# Patient Record
Sex: Female | Born: 1940 | Race: White | Hispanic: No
Health system: Southern US, Community
[De-identification: ages and names within clinical notes are randomized; demographics above are authoritative.]

---

## 1999-08-19 ENCOUNTER — Inpatient Hospital Stay (HOSPITAL_COMMUNITY): Admission: EM | Admit: 1999-08-19 | Discharge: 1999-08-22 | Payer: Self-pay | Admitting: Emergency Medicine

## 1999-08-19 ENCOUNTER — Encounter: Payer: Self-pay | Admitting: Emergency Medicine

## 2011-09-08 ENCOUNTER — Ambulatory Visit: Payer: Self-pay | Admitting: Oncology

## 2011-09-08 ENCOUNTER — Ambulatory Visit: Payer: Self-pay | Admitting: Internal Medicine

## 2011-09-17 ENCOUNTER — Inpatient Hospital Stay: Payer: Self-pay | Admitting: Internal Medicine

## 2011-09-17 LAB — COMPREHENSIVE METABOLIC PANEL
Albumin: 3.8 g/dL (ref 3.4–5.0)
Alkaline Phosphatase: 96 U/L (ref 50–136)
Anion Gap: 10 (ref 7–16)
BUN: 12 mg/dL (ref 7–18)
Bilirubin,Total: 0.7 mg/dL (ref 0.2–1.0)
Calcium, Total: 9.1 mg/dL (ref 8.5–10.1)
Chloride: 97 mmol/L — ABNORMAL LOW (ref 98–107)
Co2: 29 mmol/L (ref 21–32)
Creatinine: 0.64 mg/dL (ref 0.60–1.30)
EGFR (African American): >60
EGFR (Non-African Amer.): >60
Glucose: 112 mg/dL — ABNORMAL HIGH (ref 65–99)
Osmolality: 272 (ref 275–301)
Potassium: 3.7 mmol/L (ref 3.5–5.1)
SGOT(AST): 35 U/L (ref 15–37)
SGPT (ALT): 32 U/L
Sodium: 136 mmol/L (ref 136–145)
Total Protein: 7.6 g/dL (ref 6.4–8.2)

## 2011-09-17 LAB — URINALYSIS, COMPLETE
Bilirubin,UR: NEGATIVE
Glucose,UR: NEGATIVE mg/dL (ref 0–75)
Hyaline Cast: 4
Ketone: NEGATIVE
Nitrite: NEGATIVE
Ph: 5 (ref 4.5–8.0)
Protein: NEGATIVE
RBC,UR: 37 /HPF (ref 0–5)
Specific Gravity: 1.018 (ref 1.003–1.030)
Squamous Epithelial: 2
WBC UR: 3 /HPF (ref 0–5)

## 2011-09-17 LAB — PROTIME-INR
INR: 1
Prothrombin Time: 13.8 secs (ref 11.5–14.7)

## 2011-09-17 LAB — CBC
HCT: 41.5 % (ref 35.0–47.0)
HGB: 14 g/dL (ref 12.0–16.0)
MCH: 31.2 pg (ref 26.0–34.0)
MCHC: 33.8 g/dL (ref 32.0–36.0)
MCV: 92 fL (ref 80–100)
Platelet: 230 10*3/uL (ref 150–440)
RBC: 4.5 10*6/uL (ref 3.80–5.20)
RDW: 13 % (ref 11.5–14.5)
WBC: 12.6 10*3/uL — ABNORMAL HIGH (ref 3.6–11.0)

## 2011-09-17 LAB — CK TOTAL AND CKMB (NOT AT ARMC)
CK, Total: 53 U/L (ref 21–215)
CK-MB: 1.7 ng/mL (ref 0.5–3.6)

## 2011-09-17 LAB — TROPONIN I: Troponin-I: 0.38 ng/mL — ABNORMAL HIGH

## 2011-09-17 LAB — LIPASE, BLOOD: Lipase: 87 U/L (ref 73–393)

## 2011-09-18 DIAGNOSIS — R9431 Abnormal electrocardiogram [ECG] [EKG]: Secondary | ICD-10-CM

## 2011-09-18 DIAGNOSIS — I369 Nonrheumatic tricuspid valve disorder, unspecified: Secondary | ICD-10-CM

## 2011-09-18 DIAGNOSIS — R748 Abnormal levels of other serum enzymes: Secondary | ICD-10-CM

## 2011-09-18 LAB — BASIC METABOLIC PANEL
Anion Gap: 12 (ref 7–16)
BUN: 9 mg/dL (ref 7–18)
Calcium, Total: 8 mg/dL — ABNORMAL LOW (ref 8.5–10.1)
Chloride: 102 mmol/L (ref 98–107)
Co2: 26 mmol/L (ref 21–32)
Creatinine: 0.5 mg/dL — ABNORMAL LOW (ref 0.60–1.30)
EGFR (African American): >60
EGFR (Non-African Amer.): >60
Glucose: 127 mg/dL — ABNORMAL HIGH (ref 65–99)
Osmolality: 280 (ref 275–301)
Potassium: 3.2 mmol/L — ABNORMAL LOW (ref 3.5–5.1)
Sodium: 140 mmol/L (ref 136–145)

## 2011-09-18 LAB — CBC WITH DIFFERENTIAL/PLATELET
Basophil #: 0 10*3/uL (ref 0.0–0.1)
Basophil %: 0 %
Eosinophil #: 0.1 10*3/uL (ref 0.0–0.7)
Eosinophil %: 0.6 %
HCT: 33.9 % — ABNORMAL LOW (ref 35.0–47.0)
HGB: 11.7 g/dL — ABNORMAL LOW (ref 12.0–16.0)
Lymphocyte #: 1.4 10*3/uL (ref 1.0–3.6)
Lymphocyte %: 11.2 %
MCH: 31.5 pg (ref 26.0–34.0)
MCHC: 34.5 g/dL (ref 32.0–36.0)
MCV: 91 fL (ref 80–100)
Monocyte #: 0.7 10*3/uL (ref 0.0–0.7)
Monocyte %: 6 %
Neutrophil #: 10.1 10*3/uL — ABNORMAL HIGH (ref 1.4–6.5)
Neutrophil %: 82.2 %
Platelet: 189 10*3/uL (ref 150–440)
RBC: 3.71 10*6/uL — ABNORMAL LOW (ref 3.80–5.20)
RDW: 12.6 % (ref 11.5–14.5)
WBC: 12.3 10*3/uL — ABNORMAL HIGH (ref 3.6–11.0)

## 2011-09-18 LAB — LIPID PANEL
Cholesterol: 128 mg/dL (ref 0–200)
Ldl Cholesterol, Calc: 76 mg/dL (ref 0–100)
Triglycerides: 68 mg/dL (ref 0–200)

## 2011-09-18 LAB — TROPONIN I
Troponin-I: 0.26 ng/mL — ABNORMAL HIGH
Troponin-I: 0.16 ng/mL — ABNORMAL HIGH

## 2011-09-18 LAB — CLOSTRIDIUM DIFFICILE BY PCR

## 2011-09-18 LAB — CK TOTAL AND CKMB (NOT AT ARMC)
CK, Total: 47 U/L (ref 21–215)
CK-MB: 1.3 ng/mL (ref 0.5–3.6)

## 2011-09-19 LAB — CBC WITH DIFFERENTIAL/PLATELET
Eosinophil #: 0.2 10*3/uL (ref 0.0–0.7)
Eosinophil %: 1.4 %
HCT: 36.8 % (ref 35.0–47.0)
Lymphocyte #: 1.3 10*3/uL (ref 1.0–3.6)
MCH: 31.2 pg (ref 26.0–34.0)
MCHC: 33.7 g/dL (ref 32.0–36.0)
Monocyte #: 0.8 10*3/uL — ABNORMAL HIGH (ref 0.0–0.7)
Platelet: 166 10*3/uL (ref 150–440)
RBC: 3.97 10*6/uL (ref 3.80–5.20)
RDW: 12.9 % (ref 11.5–14.5)

## 2011-09-19 LAB — BASIC METABOLIC PANEL
BUN: 5 mg/dL — ABNORMAL LOW (ref 7–18)
Calcium, Total: 8.7 mg/dL (ref 8.5–10.1)
Creatinine: 0.64 mg/dL (ref 0.60–1.30)
Glucose: 126 mg/dL — ABNORMAL HIGH (ref 65–99)

## 2011-09-20 DIAGNOSIS — R079 Chest pain, unspecified: Secondary | ICD-10-CM

## 2011-09-23 ENCOUNTER — Emergency Department: Payer: Self-pay | Admitting: Emergency Medicine

## 2011-09-23 LAB — DIFFERENTIAL
Basophil #: 0 10*3/uL (ref 0.0–0.1)
Basophil %: 0.2 %
Eosinophil #: 0 10*3/uL (ref 0.0–0.7)
Lymphocyte #: 1.6 10*3/uL (ref 1.0–3.6)
Monocyte #: 1.5 10*3/uL — ABNORMAL HIGH (ref 0.0–0.7)
Neutrophil #: 12 10*3/uL — ABNORMAL HIGH (ref 1.4–6.5)
Neutrophil %: 79.1 %
Segmented Neutrophils: 79 %

## 2011-09-23 LAB — COMPREHENSIVE METABOLIC PANEL
Anion Gap: 12 (ref 7–16)
Bilirubin,Total: 0.7 mg/dL (ref 0.2–1.0)
Calcium, Total: 8.5 mg/dL (ref 8.5–10.1)
Chloride: 98 mmol/L (ref 98–107)
Co2: 29 mmol/L (ref 21–32)
EGFR (African American): >60
EGFR (Non-African Amer.): >60
Osmolality: 281 (ref 275–301)
Potassium: 3.6 mmol/L (ref 3.5–5.1)
SGOT(AST): 56 U/L — ABNORMAL HIGH (ref 15–37)
SGPT (ALT): 37 U/L

## 2011-09-23 LAB — CBC
MCH: 31.1 pg (ref 26.0–34.0)
MCHC: 33.7 g/dL (ref 32.0–36.0)
MCV: 92 fL (ref 80–100)
Platelet: 60 10*3/uL — ABNORMAL LOW (ref 150–440)
RDW: 12.9 % (ref 11.5–14.5)

## 2011-09-23 LAB — URINALYSIS, COMPLETE
Hyaline Cast: 2
Ketone: NEGATIVE
Nitrite: NEGATIVE
Ph: 5 (ref 4.5–8.0)
Protein: 30
Specific Gravity: 1.021 (ref 1.003–1.030)
WBC UR: 110 /HPF (ref 0–5)

## 2011-09-25 ENCOUNTER — Encounter: Payer: Self-pay | Admitting: Cardiovascular Disease

## 2011-09-26 ENCOUNTER — Inpatient Hospital Stay: Payer: Self-pay | Admitting: Internal Medicine

## 2011-09-26 LAB — COMPREHENSIVE METABOLIC PANEL
Albumin: 2.9 g/dL — ABNORMAL LOW (ref 3.4–5.0)
Bilirubin,Total: 0.6 mg/dL (ref 0.2–1.0)
Calcium, Total: 8.1 mg/dL — ABNORMAL LOW (ref 8.5–10.1)
Chloride: 103 mmol/L (ref 98–107)
Co2: 30 mmol/L (ref 21–32)
EGFR (Non-African Amer.): >60
Osmolality: 275 (ref 275–301)
Potassium: 3.5 mmol/L (ref 3.5–5.1)
SGOT(AST): 27 U/L (ref 15–37)
Sodium: 138 mmol/L (ref 136–145)
Total Protein: 6.1 g/dL — ABNORMAL LOW (ref 6.4–8.2)

## 2011-09-26 LAB — URINALYSIS, COMPLETE
Bacteria: NONE SEEN
Glucose,UR: 50 mg/dL (ref 0–75)
Ketone: NEGATIVE
Specific Gravity: 1.012 (ref 1.003–1.030)
Squamous Epithelial: 2
WBC UR: 61 /HPF (ref 0–5)

## 2011-09-26 LAB — CBC
HCT: 33.3 % — ABNORMAL LOW (ref 35.0–47.0)
HGB: 11.3 g/dL — ABNORMAL LOW (ref 12.0–16.0)
MCH: 31.3 pg (ref 26.0–34.0)
MCHC: 34 g/dL (ref 32.0–36.0)
MCV: 92 fL (ref 80–100)
Platelet: 79 10*3/uL — ABNORMAL LOW (ref 150–440)
RBC: 3.61 10*6/uL — ABNORMAL LOW (ref 3.80–5.20)
RDW: 13.4 % (ref 11.5–14.5)
WBC: 15.1 10*3/uL — ABNORMAL HIGH (ref 3.6–11.0)

## 2011-09-26 LAB — CK TOTAL AND CKMB (NOT AT ARMC)
CK, Total: 55 U/L (ref 21–215)
CK-MB: <0.5 ng/mL — ABNORMAL LOW (ref 0.5–3.6)

## 2011-09-26 LAB — TROPONIN I: Troponin-I: 0.42 ng/mL — ABNORMAL HIGH

## 2011-09-27 LAB — APTT: Activated PTT: 53.8 secs — ABNORMAL HIGH (ref 23.6–35.9)

## 2011-09-27 LAB — CBC WITH DIFFERENTIAL/PLATELET
Basophil %: 0.1 %
Eosinophil %: 0.5 %
Lymphocyte #: 1.4 10*3/uL (ref 1.0–3.6)
MCH: 31.3 pg (ref 26.0–34.0)
Monocyte #: 0.9 10*3/uL — ABNORMAL HIGH (ref 0.0–0.7)
Monocyte %: 6.7 %
Neutrophil #: 11.7 10*3/uL — ABNORMAL HIGH (ref 1.4–6.5)
Platelet: 93 10*3/uL — ABNORMAL LOW (ref 150–440)
RBC: 3.2 10*6/uL — ABNORMAL LOW (ref 3.80–5.20)
RDW: 13.3 % (ref 11.5–14.5)

## 2011-09-27 LAB — BASIC METABOLIC PANEL
BUN: 7 mg/dL (ref 7–18)
Calcium, Total: 7.2 mg/dL — ABNORMAL LOW (ref 8.5–10.1)
Chloride: 105 mmol/L (ref 98–107)
Co2: 29 mmol/L (ref 21–32)
Creatinine: 0.61 mg/dL (ref 0.60–1.30)
EGFR (Non-African Amer.): >60
Glucose: 108 mg/dL — ABNORMAL HIGH (ref 65–99)
Osmolality: 282 (ref 275–301)
Potassium: 3.2 mmol/L — ABNORMAL LOW (ref 3.5–5.1)
Sodium: 142 mmol/L (ref 136–145)

## 2011-09-27 LAB — TSH: Thyroid Stimulating Horm: 1.8 u[IU]/mL

## 2011-09-27 LAB — CK TOTAL AND CKMB (NOT AT ARMC)
CK, Total: 33 U/L (ref 21–215)
CK-MB: 1 ng/mL (ref 0.5–3.6)
CK-MB: 1 ng/mL (ref 0.5–3.6)

## 2011-09-27 LAB — TROPONIN I: Troponin-I: 0.32 ng/mL — ABNORMAL HIGH

## 2011-09-28 LAB — APTT
Activated PTT: >160 secs (ref 23.6–35.9)
Activated PTT: 49.2 secs — ABNORMAL HIGH (ref 23.6–35.9)

## 2011-09-29 ENCOUNTER — Ambulatory Visit: Payer: Self-pay | Admitting: Oncology

## 2011-09-29 LAB — APTT: Activated PTT: 81.5 secs — ABNORMAL HIGH (ref 23.6–35.9)

## 2011-09-29 LAB — PLATELET COUNT: Platelet: 233 10*3/uL (ref 150–440)

## 2011-09-29 LAB — HEMOGLOBIN: HGB: 9.9 g/dL — ABNORMAL LOW (ref 12.0–16.0)

## 2011-09-30 LAB — URINALYSIS, COMPLETE
Bilirubin,UR: NEGATIVE
Nitrite: NEGATIVE
Ph: 6 (ref 4.5–8.0)
Specific Gravity: 1.017 (ref 1.003–1.030)
Squamous Epithelial: 7

## 2011-09-30 LAB — BASIC METABOLIC PANEL
BUN: 15 mg/dL (ref 7–18)
Calcium, Total: 7.6 mg/dL — ABNORMAL LOW (ref 8.5–10.1)
Chloride: 107 mmol/L (ref 98–107)
Co2: 28 mmol/L (ref 21–32)
Creatinine: 0.72 mg/dL (ref 0.60–1.30)
EGFR (African American): >60
EGFR (Non-African Amer.): >60
Glucose: 120 mg/dL — ABNORMAL HIGH (ref 65–99)
Potassium: 3.2 mmol/L — ABNORMAL LOW (ref 3.5–5.1)
Sodium: 143 mmol/L (ref 136–145)

## 2011-09-30 LAB — APTT: Activated PTT: 79 secs — ABNORMAL HIGH (ref 23.6–35.9)

## 2011-10-01 LAB — PLATELET COUNT: Platelet: 374 10*3/uL (ref 150–440)

## 2011-10-02 LAB — CULTURE, BLOOD (SINGLE)

## 2011-10-02 LAB — APTT: Activated PTT: 32.1 secs (ref 23.6–35.9)

## 2011-10-06 ENCOUNTER — Ambulatory Visit: Payer: Self-pay | Admitting: Oncology

## 2011-11-06 ENCOUNTER — Ambulatory Visit: Payer: Self-pay | Admitting: Oncology

## 2011-11-08 ENCOUNTER — Ambulatory Visit: Payer: Self-pay | Admitting: Specialist

## 2011-11-08 LAB — CREATININE, SERUM: Creatinine: 0.53 mg/dL — ABNORMAL LOW (ref 0.60–1.30)

## 2011-11-09 ENCOUNTER — Ambulatory Visit: Payer: Self-pay | Admitting: Oncology

## 2011-11-09 ENCOUNTER — Emergency Department: Payer: Self-pay | Admitting: *Deleted

## 2011-11-20 ENCOUNTER — Ambulatory Visit: Payer: Self-pay | Admitting: Oncology

## 2011-11-20 LAB — PLATELET COUNT: Platelet: 182 10*3/uL (ref 150–440)

## 2011-11-20 LAB — PROTIME-INR
INR: 1.2
Prothrombin Time: 15.7 secs — ABNORMAL HIGH (ref 11.5–14.7)

## 2011-11-20 LAB — APTT: Activated PTT: 27.2 secs (ref 23.6–35.9)

## 2011-12-06 ENCOUNTER — Ambulatory Visit: Payer: Self-pay | Admitting: Oncology

## 2011-12-06 ENCOUNTER — Ambulatory Visit: Payer: Self-pay | Admitting: Vascular Surgery

## 2011-12-07 LAB — COMPREHENSIVE METABOLIC PANEL
Albumin: 3.5 g/dL (ref 3.4–5.0)
BUN: 13 mg/dL (ref 7–18)
Calcium, Total: 8.9 mg/dL (ref 8.5–10.1)
Chloride: 103 mmol/L (ref 98–107)
Co2: 32 mmol/L (ref 21–32)
EGFR (Non-African Amer.): >60
Potassium: 3.8 mmol/L (ref 3.5–5.1)
SGOT(AST): 35 U/L (ref 15–37)
Sodium: 141 mmol/L (ref 136–145)

## 2011-12-07 LAB — CBC CANCER CENTER
Basophil #: 0.1 x10 3/mm (ref 0.0–0.1)
Basophil %: 0.7 %
Eosinophil #: 0.1 x10 3/mm (ref 0.0–0.7)
Eosinophil %: 1.3 %
HCT: 32.6 % — ABNORMAL LOW (ref 35.0–47.0)
HGB: 10.5 g/dL — ABNORMAL LOW (ref 12.0–16.0)
Lymphocyte %: 16.9 %
MCH: 29.6 pg (ref 26.0–34.0)
Monocyte #: 0.5 x10 3/mm (ref 0.2–0.9)
Neutrophil #: 5.2 x10 3/mm (ref 1.4–6.5)
Platelet: 96 x10 3/mm — ABNORMAL LOW (ref 150–440)
RBC: 3.56 10*6/uL — ABNORMAL LOW (ref 3.80–5.20)
RDW: 14.6 % — ABNORMAL HIGH (ref 11.5–14.5)

## 2012-01-06 ENCOUNTER — Ambulatory Visit: Payer: Self-pay | Admitting: Oncology

## 2012-01-06 DEATH — deceased

## 2012-04-16 IMAGING — CT CT CHEST W/ CM
2 series · 15 of 31 positions shown, 19 images · IV contrast (agent unspecified)
Comparison: none

REASON FOR EXAM: IANIK lobe lesion
COMMENTS:

PROCEDURE:     CT  - CT CHEST WITH CONTRAST  - November 08, 2011  [DATE]
RESULT:     Comparison is made to a prior study dated 09/26/2011.
TECHNIQUE: Helical 5 mm sections were obtained from the thoracic inlet
through the lung bases status post intravenous administration of 60 mL
[id number].

[Series 2: soft tissue · axial · 0.60mm/px · z∈[-220,-160]mm · 2 of 76 slices shown]
[im 6/76  mediastinal]
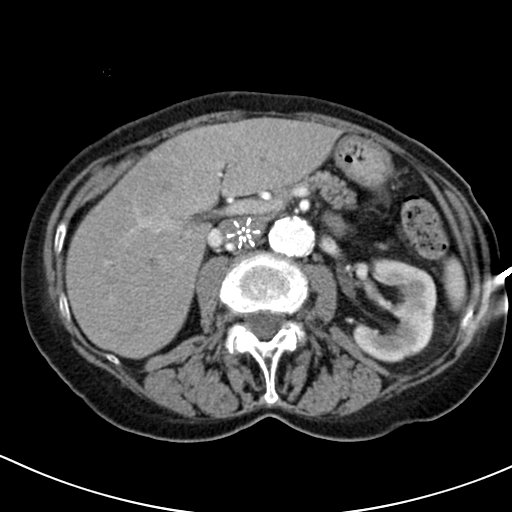
[im 18/76  mediastinal]
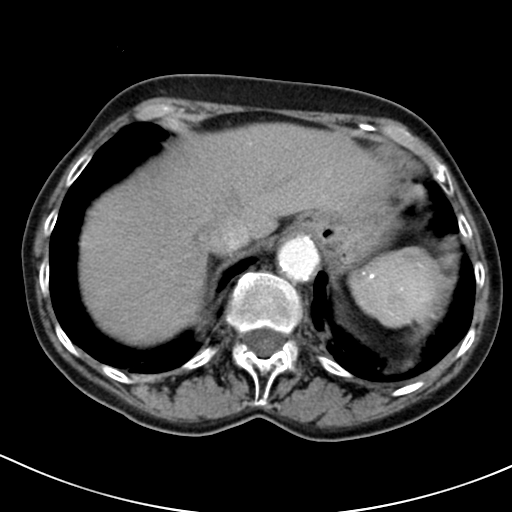

[Series 3: lung windows · axial · 0.60mm/px · z∈[-206,+100]mm · 13 of 73 slices shown, 17 images]
[im 6/73  mediastinal]
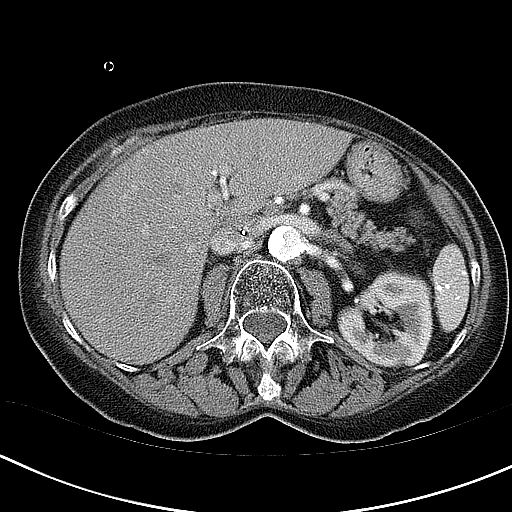
[im 6/73  lung]
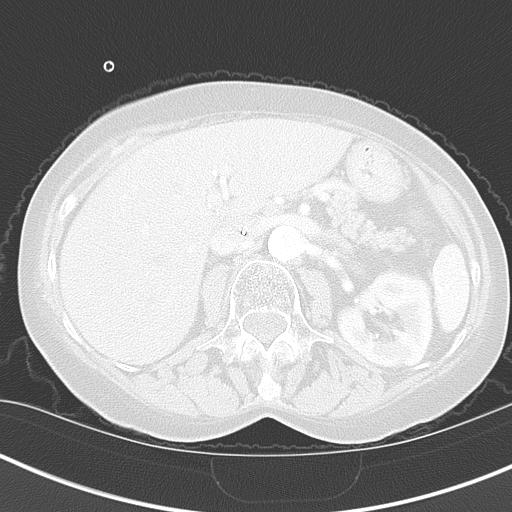
[im 12/73  lung]
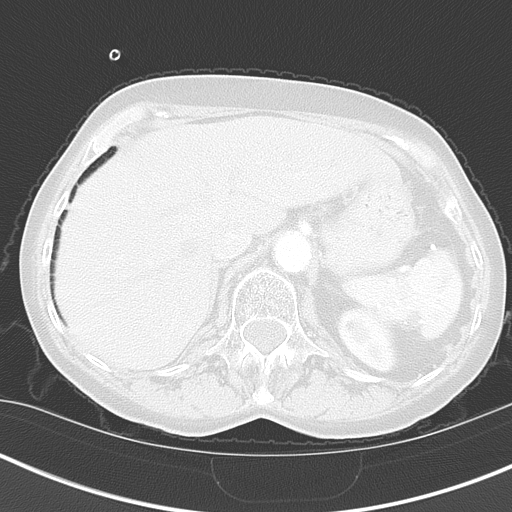
[im 17/73  lung]
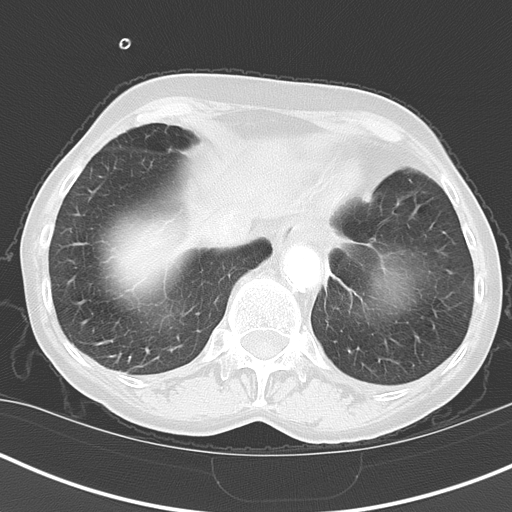
[im 23/73  lung]
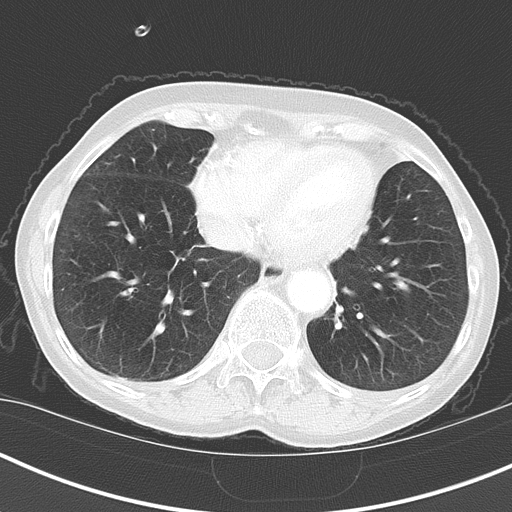
[im 28/73  mediastinal]
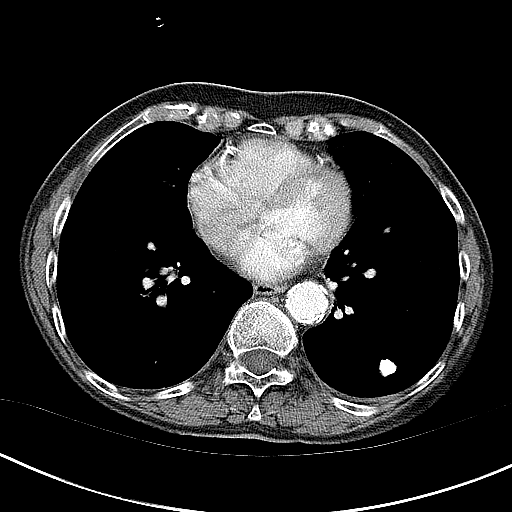
[im 28/73  lung]
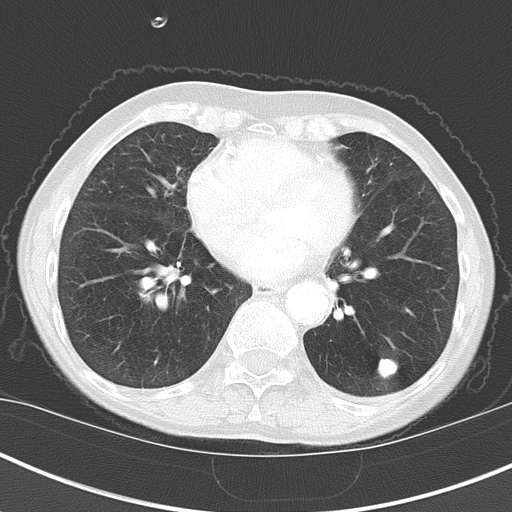
[im 34/73  lung]
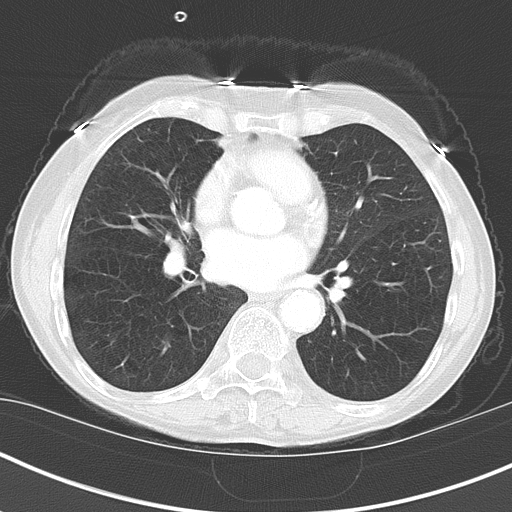
[im 37/73  lung]
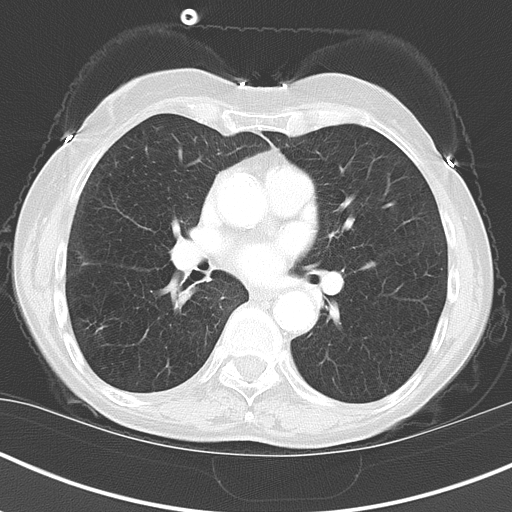
[im 39/73  lung]
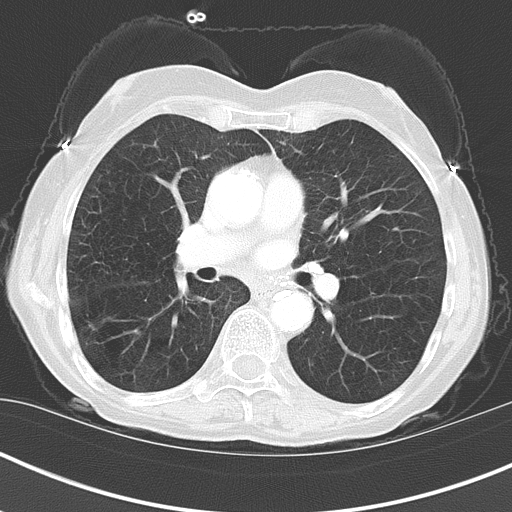
[im 45/73  mediastinal]
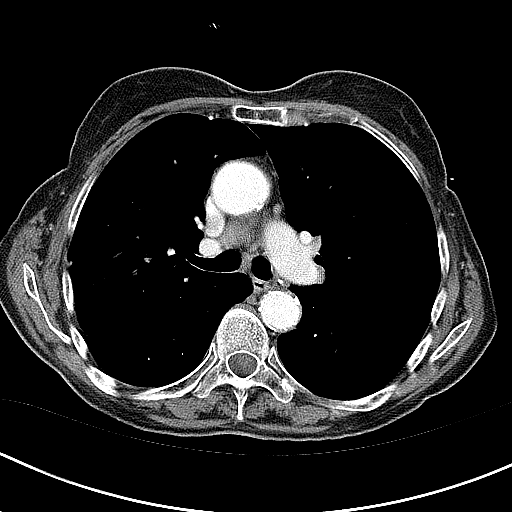
[im 45/73  lung]
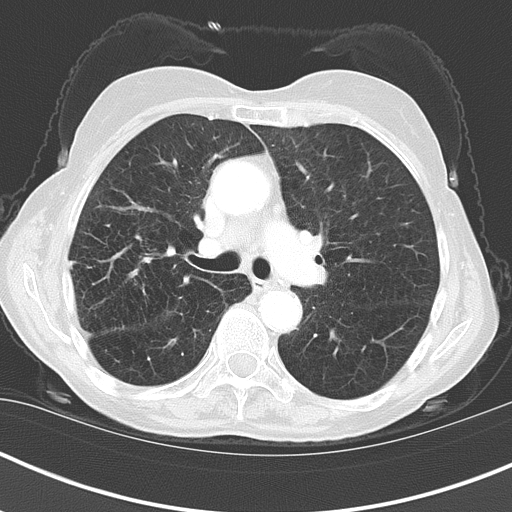
[im 50/73  lung]
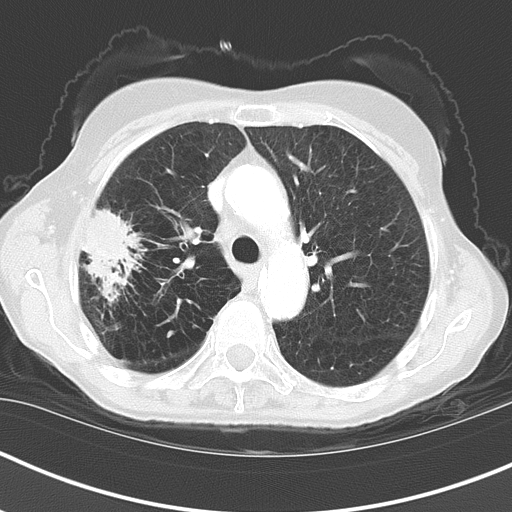
[im 56/73  lung]
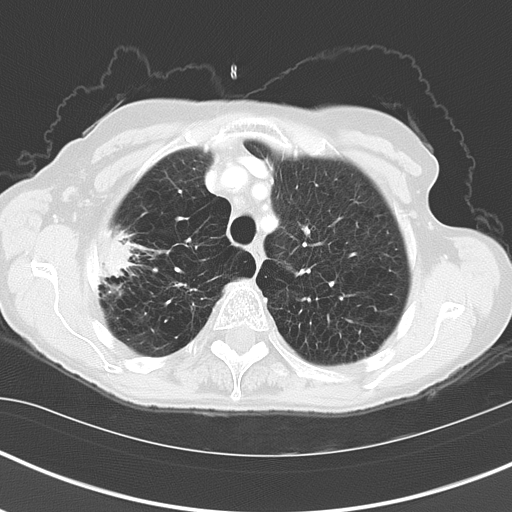
[im 61/73  lung]
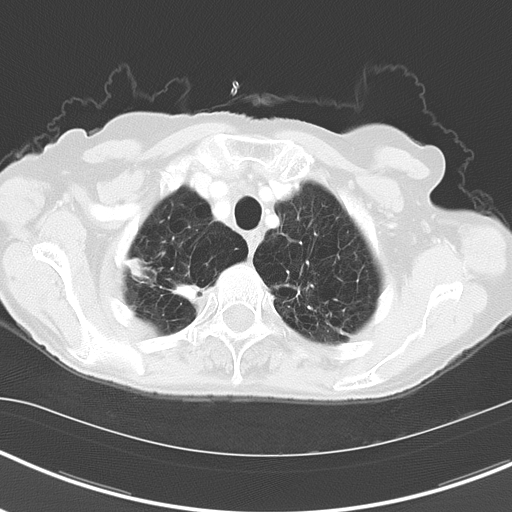
[im 67/73  mediastinal]
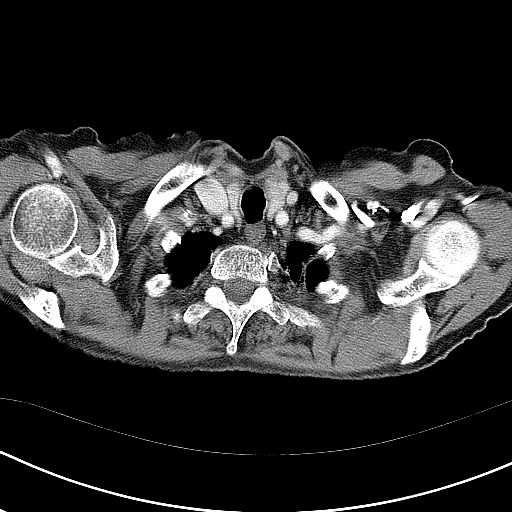
[im 67/73  lung]
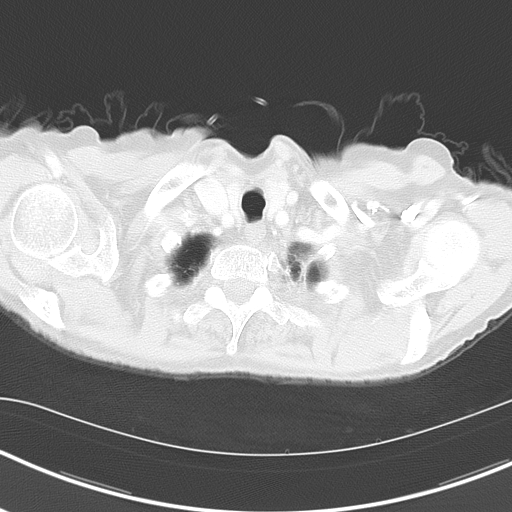

[15 of 31 positions shown; findings below may reference images not displayed]

FINDINGS: Evaluation of the mediastinum demonstrates precarinal adenopathy.
Largest nodal mass measures approximately 1.52 cm in shortest dimension.
Smaller lymph nodes are identified within the AP window.

Previously described pleural-based spiculated density within the right upper
lobe is once again appreciated and appears more prominent measuring 0.55 x
3.51 cm in orthogonal dimensions. A second nodule is identified within the
left lower lobe medial basal segment. This is calcified indicative of a
calcified granuloma. Fluid versus thickening is identified along the major
fissure on the right. There are fibrotic and emphysematous changes
throughout both lungs. Multiple calcifications are identified within the
spleen. An inferior vena cava filter is appreciated. The visualized upper
abdominal viscera are unremarkable.
IMPRESSION: 1.     Increased conspicuity of the spiculated density along the periphery
of the right mid hemithorax. Mediastinal adenopathy is appreciated.
2.     Not mentioned above, stable areas of partially calcified apical
pleural plaques are identified. These findings may represent the sequela of
prior asbestos exposure. The peripheral density in the right hemithorax, in
the appropriate clinical setting, is concerning for neoplastic disease
particularly in the absence of signs and symptoms of infection. There are
also findings concerning for mediastinal metastatic adenopathy.

## 2012-04-28 IMAGING — CT CT ASPIRATION
1 of 2 series · 15 of 32 positions shown, 20 images · non-contrast
Comparison: none

REASON FOR EXAM: RUL nodule
COMMENTS:

[Series 2: soft tissue · axial · 0.71mm/px · z∈[-141,-61]mm · 15 of 19 slices shown, 20 images]
[im 2/19  soft-tissue]
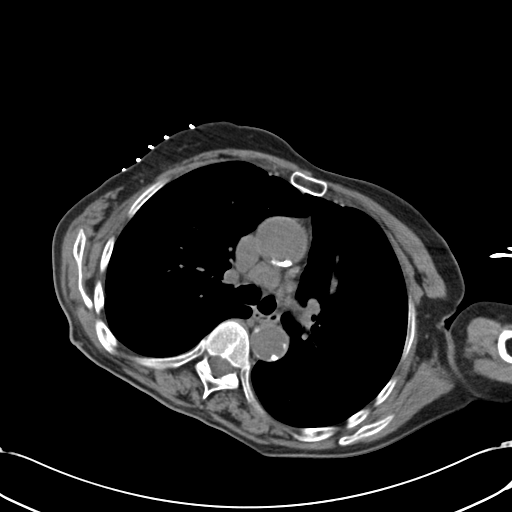
[im 2/19  bone]
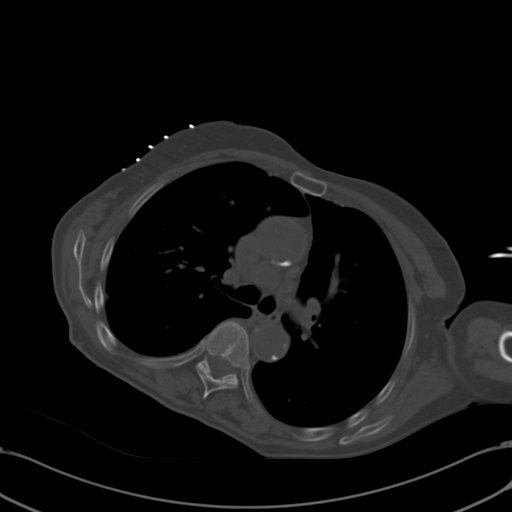
[im 3/19  soft-tissue]
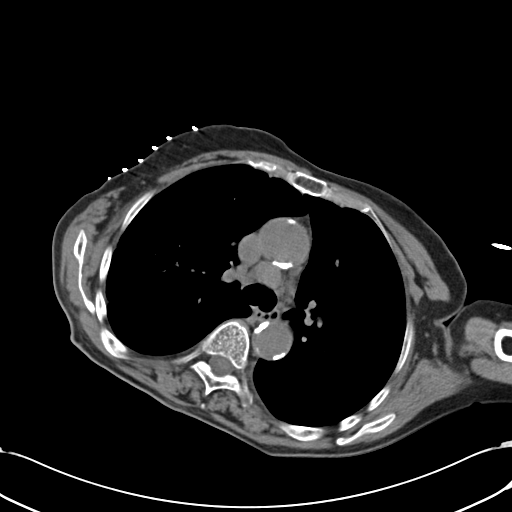
[im 5/19  soft-tissue]
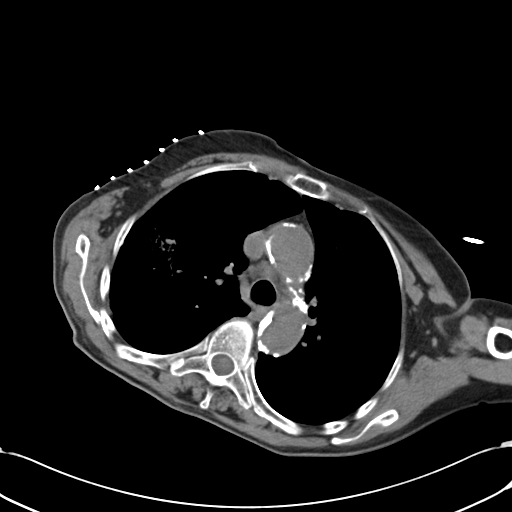
[im 6/19  soft-tissue]
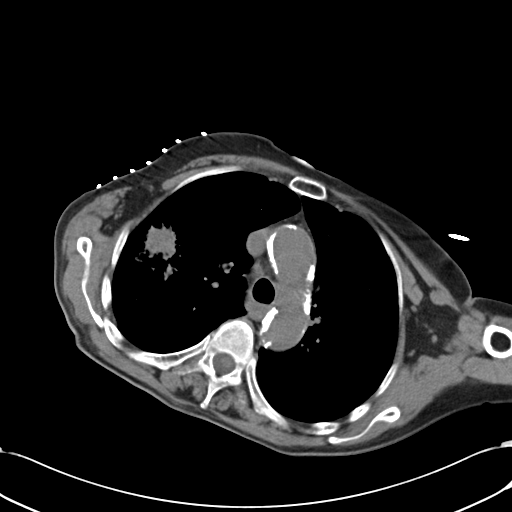
[im 7/19  soft-tissue]
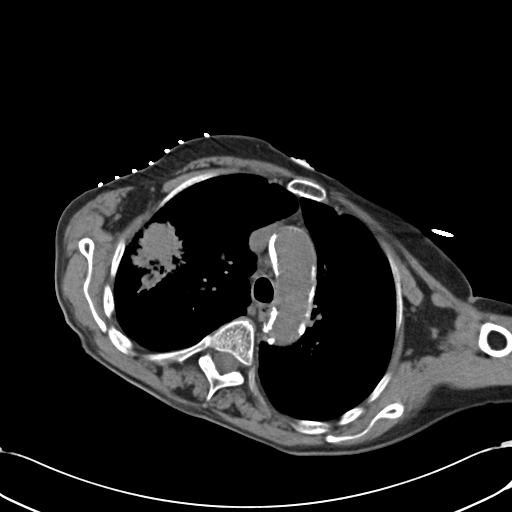
[im 8/19  soft-tissue]
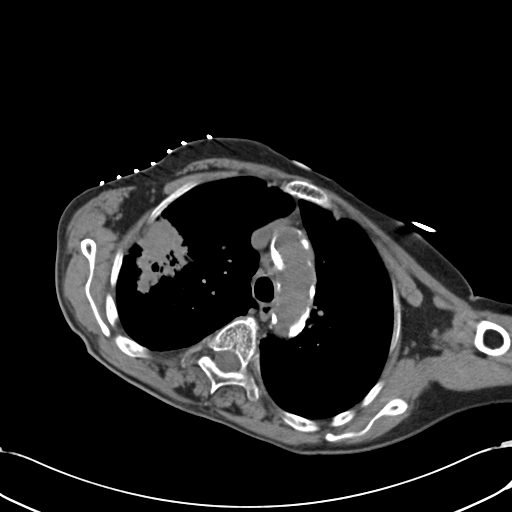
[im 9/19  soft-tissue]
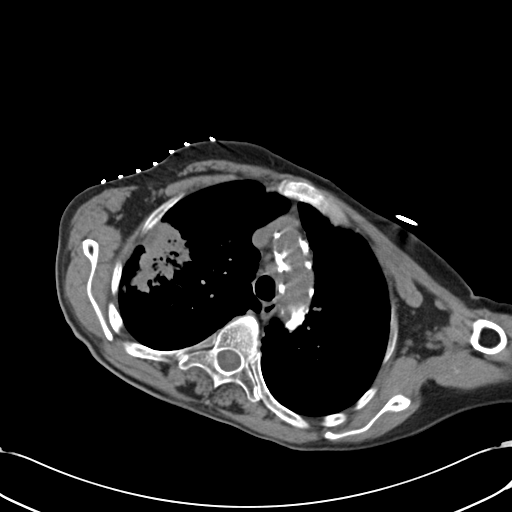
[im 11/19  soft-tissue]
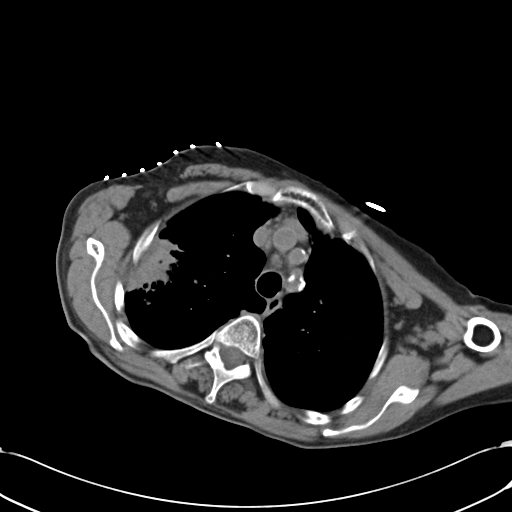
[im 12/19  soft-tissue]
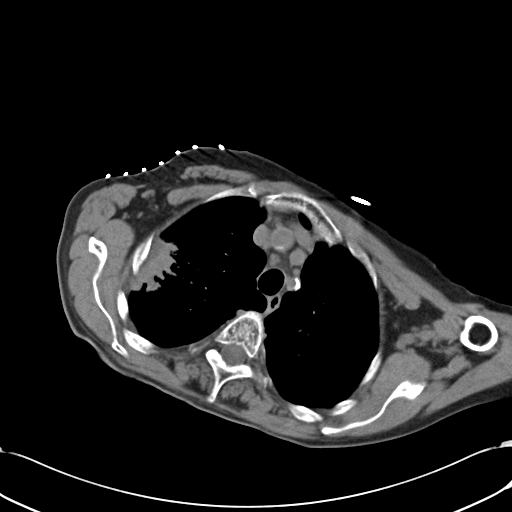
[im 12/19  bone]
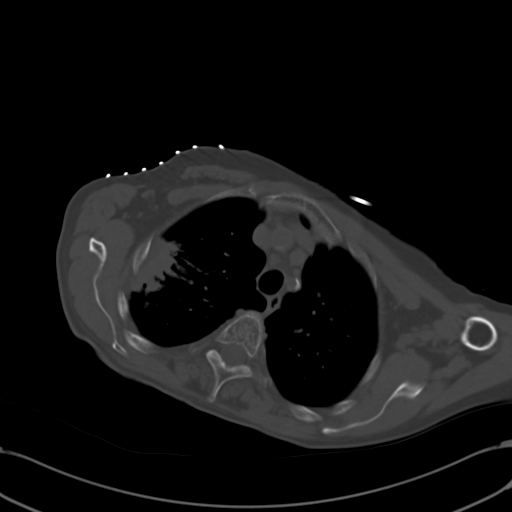
[im 13/19  soft-tissue]
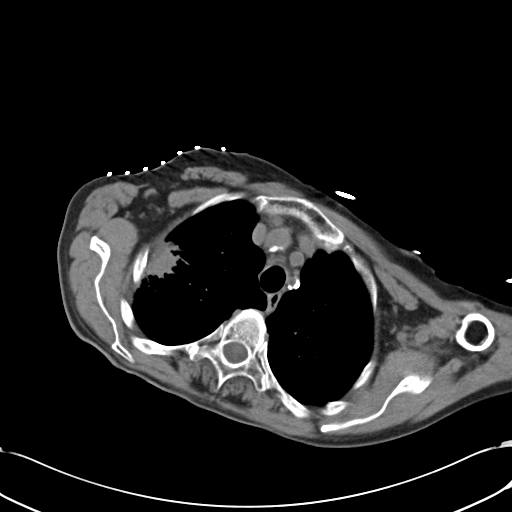
[im 14/19  soft-tissue]
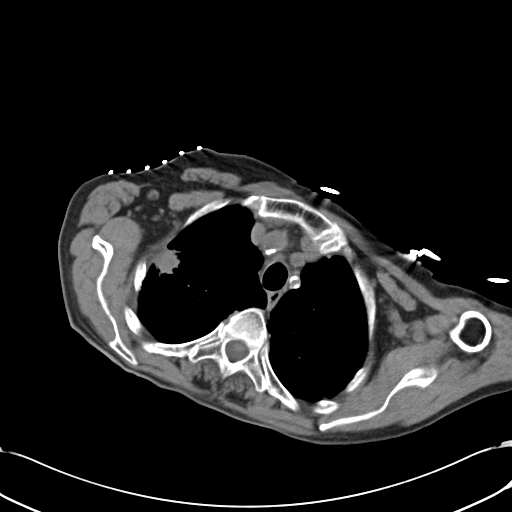
[im 15/19  soft-tissue]
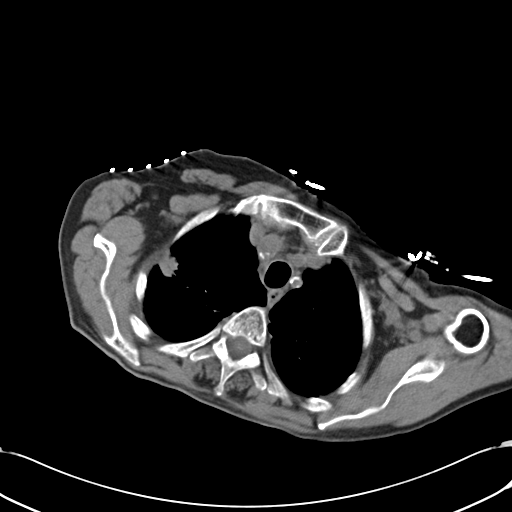
[im 15/19  lung]
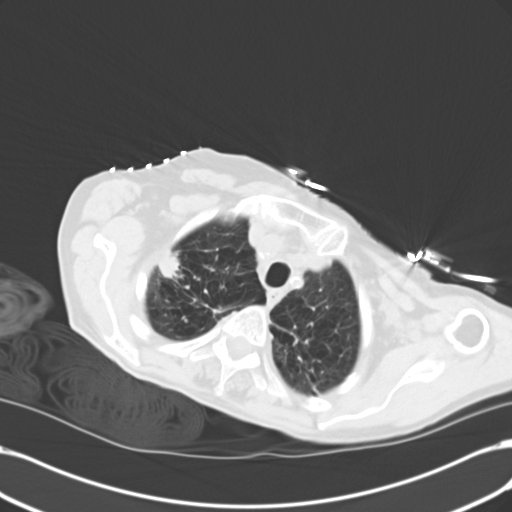
[im 16/19  lung]
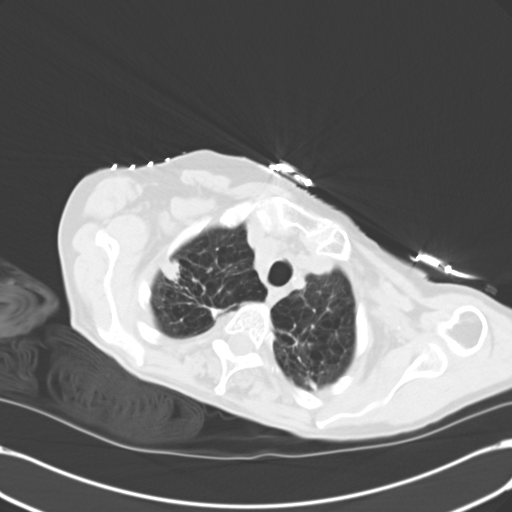
[im 17/19  soft-tissue]
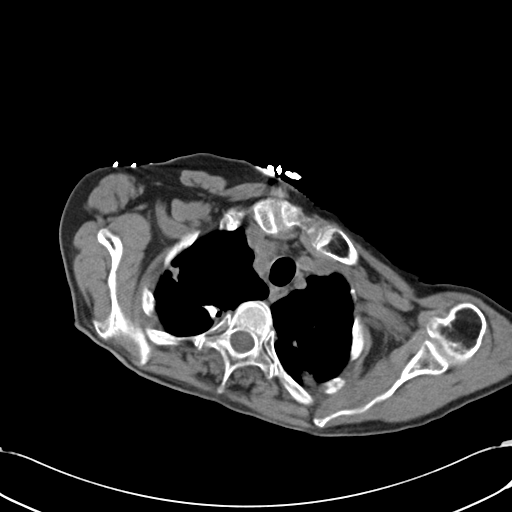
[im 17/19  lung]
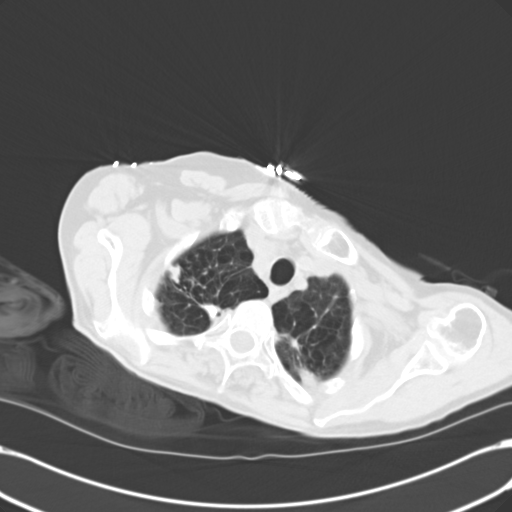
[im 18/19  soft-tissue]
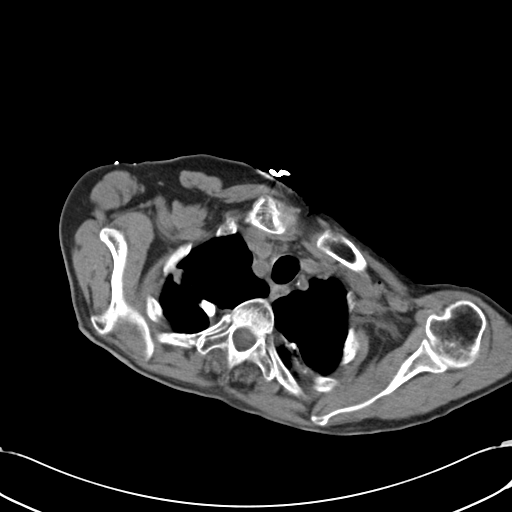
[im 18/19  lung]
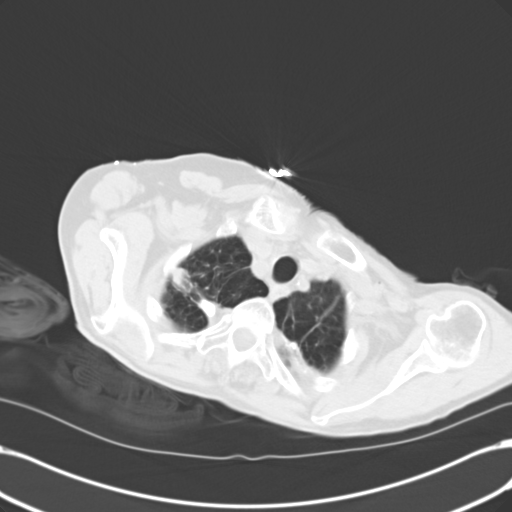

[15 of 32 positions shown; findings below may reference images not displayed]

PROCEDURE:     CT  - CT GUIDED BIOPSY or ASPIRATION  - November 20, 2011 [DATE]

RESULT:

After discussing the risks and benefits of this procedure, patient informed
consent was obtained. The right chest was sterilely prepped and draped.
Following local anesthesia with 1% Lidocaine and IV conscious sedation, a 20
gauge Achieve needle system was advanced in the right upper lobe mass and
multiple core samples obtained. Preliminary cytopathologic results indicate
the presence of malignancy. Core sample sent in [place].
IMPRESSION: Successful right upper lung mass biopsy.

## 2012-04-28 IMAGING — CR DG CHEST 1V PORT
1 series · 1 of 1 positions shown · non-contrast
Comparison: none

REASON FOR EXAM: right lung mass-post lung biopsy-expiratory view
COMMENTS:

PROCEDURE:     DXR - DXR PORTABLE CHEST SINGLE VIEW  - November 20, 2011 [DATE]
RESULT:     A right upper lobe mass lesion is present. No evidence of
pneumothorax. The cardiovascular structures are unremarkable.

[portable]
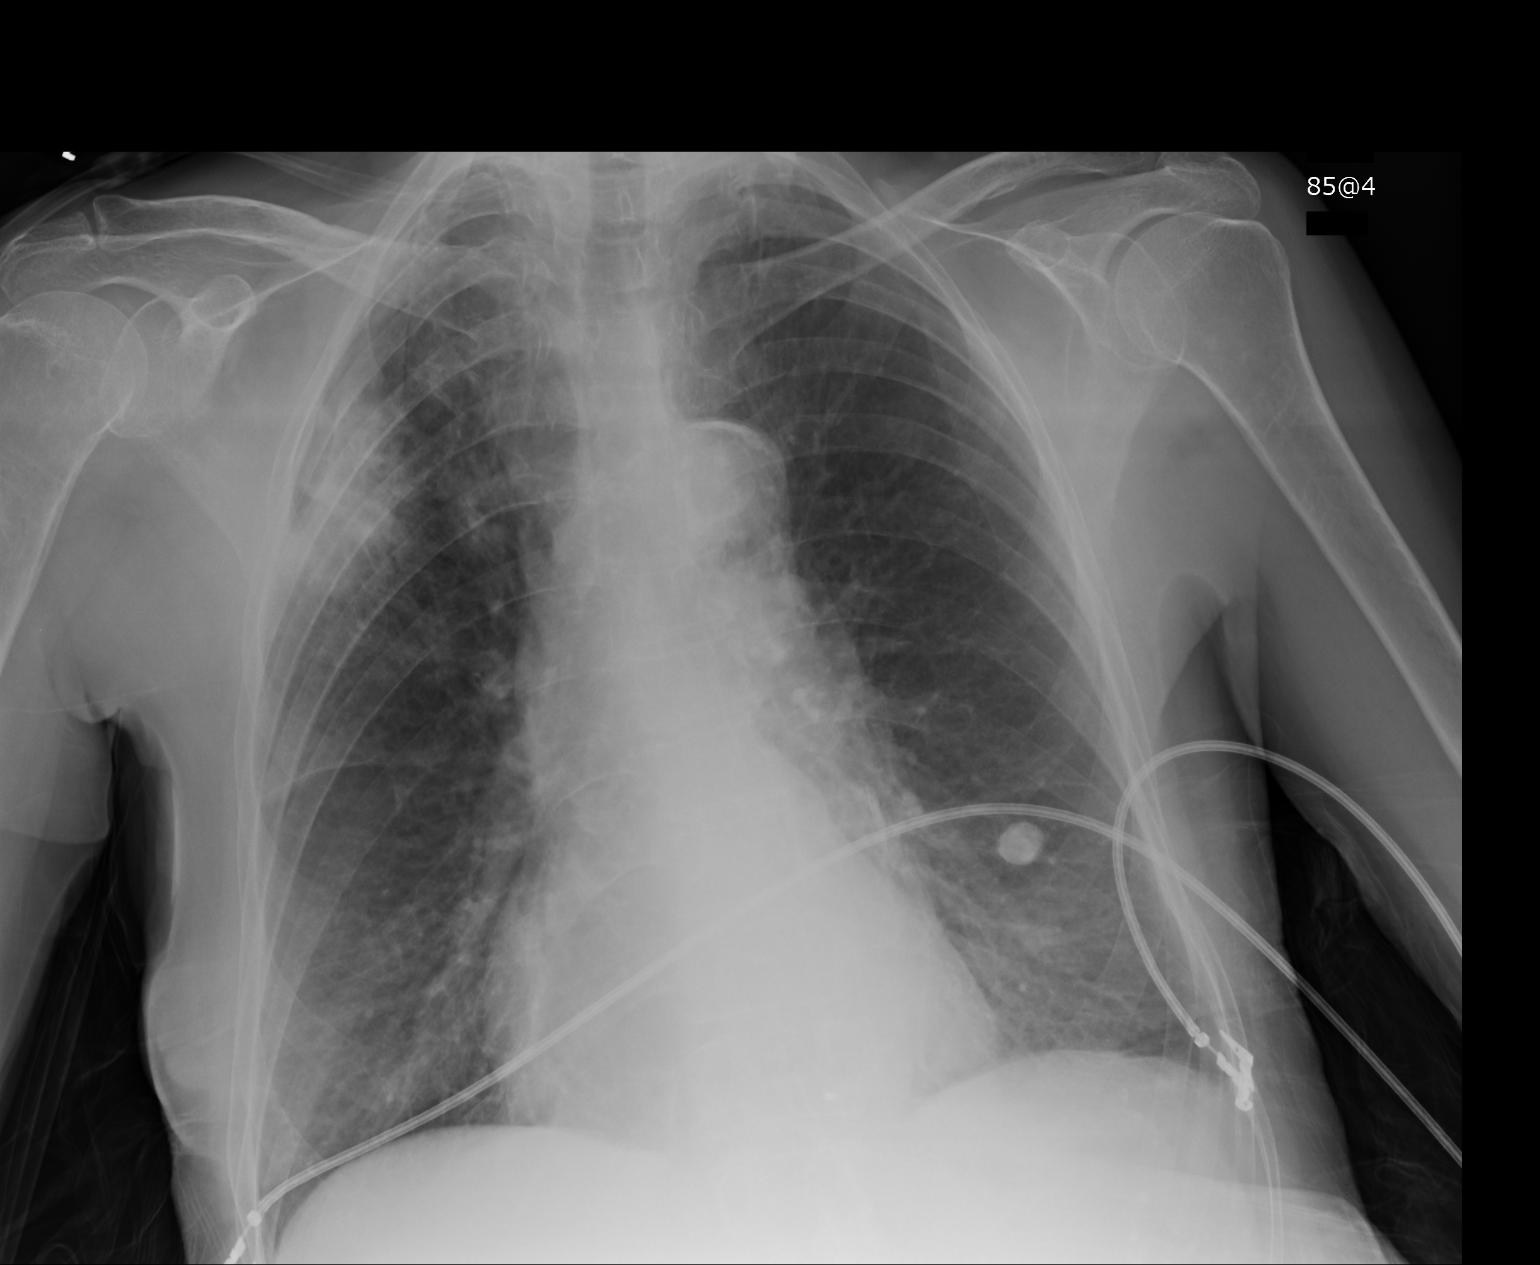

[1 of 1 positions shown; findings below may reference images not displayed]

IMPRESSION: Right upper lobe mass lesion. No evidence of pneumothorax.

## 2014-11-29 NOTE — Consult Note (Signed)
PATIENT NAME:  [name] Porter, [name] MR#:  [id number] DATE OF BIRTH:  [birth year]  DATE OF CONSULTATION:  09/18/2011  REFERRING PHYSICIAN:   CONSULTING PHYSICIAN:  Lurline DelShaukat Belem Hintze, MD  PRIMARY CARE PHYSICIAN:  Dr. Beckey DowningWillett    REASON FOR CONSULTATION: Lower GI bleed.   HISTORY OF PRESENT ILLNESS: This is a 74 year old female with history of hyperlipidemia, hypertension, and probable coronary artery disease. The patient was in her normal state of health until yesterday when she started to have some nausea and vomiting. That was followed by lower abdominal cramping and bloody bowel movement with dark blood. The patient had one bowel movement the day before yesterday which was reported as bloody and dark. The patient had one bowel movement yesterday and one bowel movement today which were also reported to be bloody and loose. The patient has not had frequent bowel movements in the last 72 hours. On arrival to the Emergency Room she was hemodynamically stable. Hemoglobin was about 14, which has dropped down to 11.7 with hydration. Her nausea and vomiting have resolved. CT scan of the abdomen and pelvis showed thickening of the wall of the transverse and descending colon, raising concerns about possible ischemic or infectious colitis. SMA, IMA, and celiac trunk appear to be patent on CT scan.   PAST MEDICAL HISTORY:   1. Benign hypertension.  2. Hyperlipidemia.  3. Anxiety. 4. Apparently the patient had an angiogram about 10 to 12 years ago which showed noncritical stenosis, although the patient has been smoking actively since then.   HOME MEDICATIONS:  1. Xanax. 2. Lotrel. 3. Lipitor.  4. Maxzide. 5. Aspirin.   ALLERGIES: None.   SOCIAL HISTORY: She has been a heavy smoker for about 50 years. She quit smoking about a month ago.   FAMILY HISTORY: Unremarkable.   REVIEW OF SYSTEMS: Negative except for what is mentioned in the History of Present Illness.   PHYSICAL EXAMINATION:  GENERAL:  Thin  female, does not appear to be toxic or septic. Does not appear to be in any acute distress.   VITAL SIGNS: Temperature 98.4, pulse 76, respirations 18, blood pressure 117/66. Clinically appears to be mildly anemic. No jaundice was noted.   NECK: Veins are flat.   LUNGS: Clear to auscultation bilaterally with fair air entry and no added sounds.   CARDIOVASCULAR: Regular rate and rhythm. No gallops or murmur.   ABDOMEN:  Mild to moderate tenderness in the mid to upper abdominal area on deep palpation. No rebound or guarding was noted. Bowel sounds are positive and hyperactive.   EXTREMITIES: No edema.   NEUROLOGIC: Examination appears to be unremarkable.   LABORATORY, DIAGNOSTIC, AND RADIOLOGICAL DATA: Patient's troponins have been elevated to as high as 0.26. Electrolytes are fairly unremarkable as well as BUN and creatinine. The highest troponin was 0.38. Liver enzymes are normal. Serum lipase is normal at 87. White cell count is 12.3, hemoglobin 11.7, platelet count 189. Hemoglobin on admission was reported to be 14, most likely secondary to hemoconcentration.  Stool for C. difficile is negative. CT scan of the abdomen and pelvis as above.   ASSESSMENT AND PLAN: The patient with acute onset nausea, vomiting, diarrhea with blood. CT scan showed thickening of the wall of the transverse and descending colon. Most likely we are dealing with an acute infectious colitis, although ischemia remains a possibility even though major vessels appear to be patent on CT scan. The patient also has increased troponins and has multiple risk factors for coronary artery disease. The patient  has been evaluated by cardiology services and probably will undergo a LexiScan in the near future. In terms of her acute colitis, which is either infectious or ischemic, I would recommend supportive care with IV hydration. Agree with clear liquid diet, agree with choice of Cipro and metronidazole. Follow hemoglobin and hematocrit  and transfuse if needed, although need for transfusion appears to be unlikely. There are no signs of active gastrointestinal blood loss and dark, bloody stools are most likely related to colitis rather than an acute bleed from lesions such as diverticulosis, AVMs, Dieulafoy lesions, et Karie Soda. The patient has not been having frequent bowel movements. Further recommendations depending on the patient's hospital course. If the patient starts to improve on the current regimen, then a colonoscopy will be delayed for several weeks, although if the patient continues to show signs of  worsening inflammation or bleeding then a sigmoidoscopy might be an option. The plan has been discussed with the patient and patient's family members. They are in full agreement. I agree with current management and will follow and make further recommendations. The plan has been discussed with cardiologist Dr. Julien Nordmann as well.    ____________________________ Lurline Del, MD si:bjt D: 09/19/2011 00:05:26 ET T: 09/19/2011 10:00:35 ET JOB#: [id number]  cc: Lurline Del, MD, <Dictator> Lurline Del MD ELECTRONICALLY SIGNED 09/21/2011 12:58

## 2014-11-29 NOTE — Consult Note (Signed)
Chief Complaint:   Subjective/Chief Complaint Feels better. No diarrhea or bleeding today. Plan as before. Will see as OP in 2 weeks. Will sign off. Thanks.   Electronic Signatures: Lurline DelIftikhar, Tavionna Grout (MD)  (Signed 13-Feb-13 10:48)  Authored: Chief Complaint   Last Updated: 13-Feb-13 10:48 by Lurline DelIftikhar, Muhanad Torosyan (MD)

## 2014-11-29 NOTE — Discharge Summary (Signed)
PATIENT NAME:  [name] DouglasBARHAM, Teonia MR#:  [id number] DATE OF BIRTH:  06/19/1941  DATE OF ADMISSION:  09/17/2011 DATE OF DISCHARGE:  09/20/2011  DISCHARGE DIAGNOSES:  1. Colitis.  2. Hypertension.  3. Hyperlipidemia.  4. Anxiety.  5. Leukocytosis.  6. Anemia.  7. Hypokalemia.  8. Hyperglycemia.  9. Elevated troponins due to demand ischemia with negative stress test. 10. Ongoing chest discomfort, possibly related to anxiety.   CONSULTATIONS:  1. GI consultation with Dr. Niel HummerIftikhar. 2. Cardiology consultation with Dr. Mariah MillingGollan.   DISPOSITION: Patient is being discharged home.   FOLLOW UP: Follow up with primary care physician, Dr. Beckey DowningWillett and Dr. Niel HummerIftikhar in 1 to 2 weeks after discharge.   DIET: Soft, easy to digest.   DISCHARGE MEDICATIONS:  1. Lipitor 20 mg daily.  2. Lopressor 25 mg b.i.d.  3. Lotrel 5/10 mg daily. 4. Xanax 0.25 mg every eight hours p.r.n.  5. Norco 5/325, 1 tablet every six hours p.r.n.  6. Cipro 500 mg b.i.d. for 10 days. 7. Flagyl 500 mg t.i.d. for 10 days.   LABORATORY, DIAGNOSTIC, AND RADIOLOGICAL DATA: Inpatient nuclear stress test showed no evidence of ischemia. CT of the abdomen showed colitis in the transverse and descending colon. 2-D echocardiogram showed normal ejection fraction of more than 55%, normal left ventricular wall motion, normal right ventricular systolic function. Microbiology: Stool culture was negative. Stool for C. difficile was also negative. White count 13, hemoglobin 12.4, platelet count 166, potassium 3.2 which was supplemented, reactive hyperglycemia. Fasting lipid profile showed LDL was at goal of 76. Normal lipase. Minimally elevated troponins ranging from 0.38 to 0.16.   HOSPITAL COURSE: Patient is a 74 year old female with past medical history of hypertension, hyperlipidemia, anxiety who presented with abdominal pain and hematochezia. A CAT scan of the abdomen showed colitis. Patient was admitted to the hospital and started on IV fluids,  clear liquid diet and IV Cipro and Flagyl. Stool studies were sent. Stool culture showed no growth. C. difficile was also negative. Patient was evaluated by Dr. Niel HummerIftikhar during the hospitalization. The differential diagnosis of colitis infectious versus ischemic. Dr. Niel HummerIftikhar felt that it was more likely infectious and not ischemic since patient had a CAT scan with contrast which had showed no evidence of any major blockages in the abdominal blood vessel. Patient had mild leukocytosis and hyperglycemia which were likely due to acute infection. She had mild hypokalemia which has been supplemented and has resolved. Her LDL is at goal. Her blood pressure has been overall well controlled. Her diuretic therapy has been discontinued. She will treat her hypertension with Lotrel and Lopressor. Patient had mildly elevated troponins which were felt to be due to demand ischemia. Her echocardiogram showed normal ejection fraction. Due to ongoing chest symptoms a cardiology consultation with Dr. Mariah MillingGollan was obtained and patient underwent an inpatient nuclear stress test which showed no evidence of ischemia. Patient has ongoing symptoms of some chest tightness which is possibly related to her underlying anxiety. Patient is being discharged home in a stable condition. She will follow up with her primary care physician in 1 to 2 weeks after discharge in addition to Dr. Niel HummerIftikhar.   TIME SPENT: 45 minutes.  ____________________________ Darrick MeigsSangeeta Aerilynn Goin, MD sp:cms D: 09/20/2011 17:27:52 ET T: 09/21/2011 11:31:17 ET JOB#: [id number]  cc: Darrick MeigsSangeeta Darcee Dekker, MD, <Dictator> Jorje GuildGlenn R. Beckey DowningWillett, MD Darrick MeigsSANGEETA Kentarius Partington MD ELECTRONICALLY SIGNED 09/21/2011 15:34

## 2014-11-29 NOTE — Consult Note (Signed)
Patient with BLE DVT, history of GI bleed.  Will not be able to be anti-coagulated.  Will place IVC filter today  Electronic Signatures: Annice Needyew, Garvis Downum S (MD)  (Signed on 20-Feb-13 14:27)  Authored  Last Updated: 20-Feb-13 14:27 by Annice Needyew, Wilmoth Rasnic S (MD)

## 2014-11-29 NOTE — Consult Note (Signed)
History of Present Illness:   Reason for Consult PET positive right subpleural mass concerning for underlying malignancy.    HPI   Patient is a 74 year old female with multiple medical problems who was admitted to the hospital in February 10 through February 13 for management of colitis and lower GI bleed.  Patient readmitted recently for worsening exhaustion and increasing shortness of breath. Subsequent CT of the chest revealed not only bilateral pulmonary emboli, but right upper lobe mass concerning for malignancy.  The mass is also PET positive.  Currently, patient feels improved but not back to her baseline.  She continues to have mild short of breath, but denies chest pain.  She has no neurologic complaints.  She denies any recent weight loss.  She denies any night sweats.  She has no nausea, vomiting, constipation, or diarrhea.  She has no urinary complaints.  Patient otherwise feels well and offers no further specific complaints.  PFSH:   Additional Past Medical and Surgical History Past medical history: Hypertension, hyperlipidemia, venous stasis, anxiety, CAD, colitis.  Past surgical history: Bilateral tubal ligation.  Social history: Positive tobacco quit in December 2012.  Denies alcohol use.  Family history: Sister died of "cancer" at age of 74.   Review of Systems:   Performance Status (ECOG) 1    Review of Systems   As per HPI. Otherwise, 10 point system review was negative.   NURSING NOTES: **Vital Signs.:   22-Feb-13 13:47    Vital Signs Type: Routine    Temperature Temperature (F): 98.2    Celsius: 36.7    Temperature Source: oral    Pulse Pulse: 81    Pulse source: per Dinamap    Respirations Respirations: 18    Systolic BP Systolic BP: 118    Diastolic BP (mmHg) Diastolic BP (mmHg): 61    Mean BP: 80    BP Source: Dinamap    Pulse Ox % Pulse Ox %: 94    Pulse Ox Activity Level: At rest    Oxygen Delivery: 3L   Physical Exam:   Physical  Exam General: Well-developed, well-nourished, no acute distress. Eyes: Pink conjunctiva, anicteric sclera. HEENT: Normocephalic, moist mucous membranes, clear oropharnyx. Lungs: Clear to auscultation bilaterally. Heart: Regular rate and rhythm. No rubs, murmurs, or gallops. Abdomen: Soft, nontender, nondistended. No organomegaly noted, normoactive bowel sounds. Musculoskeletal: No edema, cyanosis, or clubbing. Neuro: Alert, answering all questions appropriately. Cranial nerves grossly intact. Skin: No rashes or petechiae noted. Psych: Normal affect. Lymphatics: No cervical, calvicular, axillary or inguinal LAD.    No Known Allergies:     Vantin 200 mg oral tablet: 1 tab(s)  2 times a day x 7 days , Active, 14, None   Lipitor 20 mg oral tablet: 1 tab(s) orally once a day (at bedtime), Active, 0, None   Lotrel 5 mg-10 mg oral capsule: 1 cap(s) orally once a day, Active, 0, None   Xanax 0.25 mg oral tablet: 1 tab(s) orally 3 times a day, As Needed- for Anxiety, Nervousness , Active, 0, None   Norco 5 mg-325 mg oral tablet: 1 tab(s) orally every 6 hours for Pain , Active, 0, None   Cipro 500 mg oral tablet: 1 tab(s) orally every 12 hours 10 days, Active, 0, None   Flagyl 500 mg oral tablet: 1 tab(s) orally every 8 hours for 10 days, Active, 0, None   metoprolol tartrate 25 mg oral tablet: 1 tab(s) orally 2 times a day, Active, 0, None    Routine  Hem:  22-Feb-13 00:10    Hemoglobin (CBC) 9.9   Platelet Count (CBC) 233  Routine Coag:  22-Feb-13 00:10    Activated PTT (APTT) 81.5   Assessment and Plan:  Impression:   PET positive pulmonary mass concerning for underlying malignancy.  Plan:   1.  Lung mass: Highly concerning for underlying malignancy, probably lung given her extensive smoking history.  Patient is scheduled to have a CT guided biopsy on Monday.  Will likely have results several days later.  Once the are finalized, we can then further discuss treatment options with  patient.  If patient gets discharged prior to results returning, [facility] evaluation and treatment planning. PEs: On heparin drip, Coumadin on hold secondary to upcoming biopsy.  Goal INR 2.0 and 3.0. consult, will follow.  Electronic Signatures: Gerarda Fraction (MD)  (Signed [phone] 16:49)  Authored: HISTORY OF PRESENT ILLNESS, PFSH, ROS, NURSING NOTES, PE, ALLERGIES, HOME MEDICATIONS, LABS, ASSESSMENT AND PLAN   Last Updated: 22-Feb-13 16:49 by Gerarda Fraction (MD)

## 2014-11-29 NOTE — Op Note (Signed)
PATIENT NAME:  [name] Porter, [name] Porter MR#:  [id number] DATE OF BIRTH:  1941-03-22  DATE OF PROCEDURE:  09/27/2011  PREOPERATIVE DIAGNOSES:  1. Deep vein thrombosis of bilateral lower extremities.  2. Gastrointestinal bleed contraindicating anticoagulation.   POSTOPERATIVE DIAGNOSES: 1. Deep venous thrombosis of bilateral lower extremities. 2. Gastrointestinal bleed contraindicating anticoagulation.   PROCEDURE PERFORMED: 1. Ultrasound guidance for vascular access, right femoral vein.  2. Catheter placement into inferior vena cava.  3. Inferior venacavogram.  4. Placement of [employer] [facility] IVC filter.   SURGEON: Annice NeedyJason S. Isham Smitherman, MD   ANESTHESIA: Local.   ESTIMATED BLOOD LOSS: Minimal.   INDICATION FOR PROCEDURE: The patient is a 74 year old white female with GI bleeding history who was found to have bilateral lower extremity deep vein thrombosis. She cannot be anticoagulated, and we are asked to place an IVC filter.   DESCRIPTION OF PROCEDURE: The patient was brought to the Vascular Interventional Radiology Suite. The groins were shaved and prepped, and a sterile surgical field was created. The right femoral vein was visualized with ultrasound and found to be patent at the common femoral location. It was then accessed under direct ultrasound guidance without difficulty with a Seldinger needle and permanent image was recorded. A J-wire was placed after skin nick and dilatation. The delivery sheath was placed over the wire into the inferior vena cava, and an inferior vena cavogram was performed. This showed the level of the renal vein is approximately L1, and the filter was deployed at L2.  Pressure was held. Sterile dressing was placed.    The patient tolerated the procedure well.   ____________________________ Annice NeedyJason S. Jd Mccaster, MD jsd:cbb D: 09/27/2011 15:13:37 ET T: 09/27/2011 15:33:01 ET JOB#: [id number]  cc: Annice NeedyJason S. Kianni Lheureux, MD, <Dictator> Annice NeedyJASON S Danyle Boening MD ELECTRONICALLY SIGNED 09/29/2011 13:25

## 2014-11-29 NOTE — H&P (Signed)
PATIENT NAME:  [name] Porter, Yobana MR#:  [id number] DATE OF BIRTH:  1941/01/25  DATE OF ADMISSION:  09/17/2011  REFERRING PHYSICIAN: Dr. Margarita GrizzleWoodruff.   FAMILY PHYSICIAN: Dr. Beckey DowningWillett.   REASON FOR ADMISSION: Lower gastrointestinal bleed with abnormal troponin and abnormal EKG.   HISTORY OF PRESENT ILLNESS: The patient is a 74 year old female with a history of hyperlipidemia and benign hypertension who presents to the Emergency Room with 3 to 4 episodes of bright red blood per rectum since last night. having lower abdominal pain and cramping. Had one episode of nausea and vomiting. In the Emergency Room, the patient was hemodynamically stable, but was noted as having an abnormal EKG with a mildly elevated troponin. She is now admitted for further evaluation.   PAST MEDICAL HISTORY:  1. Benign hypertension.  2. Hyperlipidemia.  3. Venostasis with peripheral edema.  4. Anxiety.   MEDICATIONS:  1. Xanax 0.25 mg p.o. every six hours p.r.n.  2. Lotrel 10/20, one p.o. daily.  3. Lipitor 20 mg p.o. daily.  4. Maxzide 37.5/25 mg 1 p.o. daily.  5. Aspirin 81 mg p.o. daily.   ALLERGIES: No known drug allergies.   SOCIAL HISTORY: The patient quit smoking one month ago. No history of alcohol abuse.   FAMILY HISTORY: Positive for hypertension and stroke.   REVIEW OF SYSTEMS: CONSTITUTIONAL: No fever or change in weight. EYES: No blurred or double vision. No glaucoma. ENT: No tinnitus or hearing loss. No nasal discharge or bleeding. No difficulty swallowing. RESPIRATORY: No cough or wheezing. Denies hemoptysis. CARDIOVASCULAR: No chest pain or orthopnea. No palpitations or syncope. GASTROINTESTINAL: As per history of present illness. GU: No dysuria or hematuria. No incontinence. ENDOCRINE: No polyuria or polydipsia. No heat or cold intolerance. HEMATOLOGIC: The patient denies anemia, easy bruising, or bleeding. LYMPHATIC: No swollen glands. MUSCULOSKELETAL: The patient denies pain in her neck, back, shoulders,  knees, or hips. No gout. NEUROLOGIC: No numbness or migraines. Denies stroke or seizures. PSYCH: The patient denies anxiety, insomnia, or depression.   PHYSICAL EXAMINATION:  GENERAL: The patient is in no acute distress.   VITAL SIGNS: Vital signs are currently remarkable for a blood pressure of 145/91 with a heart rate of 82 and a respiratory rate of 16. She is afebrile.   HEENT: Normocephalic, atraumatic. Pupils equally round and reactive to light and accommodation. Extraocular movements are intact. Sclerae anicteric. Conjunctivae are clear. Oropharynx is clear.  NECK: Supple without jugular venous distention or bruits. No adenopathy or thyromegaly is noted.   LUNGS: Clear to auscultation and percussion without wheezes, rales, or rhonchi. No dullness.   CARDIAC: Regular rate and rhythm, normal S1, S2. No significant rubs, murmurs, or gallops. PMI is nondisplaced. Chest wall is nontender.   ABDOMEN: Soft, but tender in the lower quadrants bilaterally. No rebound or guarding. Hyperactive bowel sounds. No organomegaly or masses were appreciated.   EXTREMITIES: Without clubbing, cyanosis, or edema. Pulses were 2+ bilaterally.   SKIN: Warm and dry without rash or lesions.   NEUROLOGIC: Cranial nerves II through XII grossly intact. Deep tendon reflexes were symmetric. Motor and sensory exam is nonfocal.   PSYCH: The patient was alert and oriented to person, place, and time. She was cooperative and used good judgment.   LABORATORY, DIAGNOSTIC AND RADIOLOGICAL DATA: EKG revealed sinus rhythm with questionable old septal myocardial infarction. Troponin was 0.38. Glucose 112 with a BUN of 12 and a creatinine of 0.64 with a sodium of 136 and a potassium of 3.7. White count was 12.6 with a  hemoglobin of 14.0.   ASSESSMENT:  1. Lower gastrointestinal bleed with abdominal pain.  2. Elevated troponin.  3. Abnormal EKG.  4. Benign hypertension.  5. Hyperlipidemia.   PLAN:  1. The patient will be  admitted to the floor with IV fluids and IV Protonix.  2. We will send off stool studies and begin empiric IV Cipro and IV Flagyl.  3. Will obtain an abdominal CT and follow her hemoglobin closely.  4. We will consult gastroenterology for further treatment and evaluation.  5. In regards to her elevated troponin and abnormal EKG, we will follow serial cardiac enzymes and obtain an echocardiogram. We will also consult cardiology in the morning for further treatment and evaluation of these issues.  6. We will continue her outpatient regimen for now.  7. Begin clear liquid diet.  8. Further treatment and evaluation will depend upon the patient's progress.   TOTAL TIME SPENT ON THIS PATIENT: 50 minutes.   ____________________________ Duane Lope Judithann Sheen, MD jds:ap D: 09/17/2011 19:02:08 ET T: 09/18/2011 07:02:27 ET JOB#: [id number]  cc: Duane Lope. Judithann Sheen, MD, <Dictator> Jorje Guild. Beckey Downing, MD Eloyce Bultman Rodena Medin MD ELECTRONICALLY SIGNED 09/18/2011 10:02

## 2014-11-29 NOTE — Consult Note (Signed)
Brief Consult Note: Comments: Full consult dictated. Probable acute infectious colitis. Agree with current management. Will follow.  Electronic Signatures: Lurline DelIftikhar, Jamail Cullers (MD)  (Signed 12-Feb-13 00:07)  Authored: Brief Consult Note   Last Updated: 12-Feb-13 00:07 by Lurline DelIftikhar, Kyria Bumgardner (MD)

## 2014-11-29 NOTE — Consult Note (Signed)
PATIENT NAME:  [name] Porter, [name] Porter DATE OF BIRTH:  05-13-1941  DATE OF CONSULTATION:  09/27/2011  REFERRING PHYSICIAN:   CONSULTING PHYSICIAN:  Annice NeedyJason S. Dew, MD  REASON FOR CONSULTATION: Deep venous thrombosis with contraindication to anticoagulation.   HISTORY OF PRESENT ILLNESS: This is a 74 year old white female with a history of GI bleed on multiple occasions who is admitted with multiple ongoing issues including shortness of breath and bilateral lower extremity swelling. She was found to have bilateral lower extremity DVTs on duplex ultrasonography today, which was performed her lower extremity symptoms. She had no previous knowledge of this and no previous history of thrombotic or embolic disease to her knowledge. She also has a lesion in the lung fields that is concerning for malignancy, which could be the cause for her hypercoagulability. Due to her previous and recent gastrointestinal bleeding, the internal medicine physicians are not comfortable administering her anticoagulation and for this reason we are consulted for consideration of IVC filter placement.   PAST MEDICAL HISTORY:  1. Hypertension.  2. Hyperlipidemia.  3. Coronary artery disease.  4. Anxiety.  5. Previous long-standing tobacco abuse having quit in the last three months.  6. Admitted earlier this month with significant colitis and lower gastrointestinal bleeding.   PAST SURGICAL HISTORY: Tubal ligation.   ALLERGIES: No known drug allergies.   MEDICATIONS:  1. Lipitor 20 mg daily.  2. Lopressor 25 mg twice a day. 3. Lotrel 5/10 mg daily.  4. Xanax 0.25 mg every 8 hours p.r.n.  5. Norco as needed for pain.  6. Cipro 500 mg twice a day, which was to stop later this week. 7. Flagyl 500 mg three times daily, which was also to be stopped later this week.  SOCIAL HISTORY: She had long-standing tobacco use which stopped in December of last year. No alcohol or drug abuse. [place] She is  still working full time and is completely independent.   FAMILY HISTORY: No significant heart disease or thromboembolic problems.  REVIEW OF SYSTEMS: GENERAL: No fevers or chills. Positive for malaise over the past few days. EYES: No recent blurred or double vision. EARS: No tinnitus or ear pain. CARDIOVASCULAR: She does have some chest pain with deep inspiration. She has a history of coronary artery disease and angina. RESPIRATORY: She does have some shortness of breath with mild exertion. No cough or hemoptysis. GASTROINTESTINAL: No current nausea, vomiting, or diarrhea, but she did have colitis with bloody stools earlier this month. It was pretty significant bleeding, per the report. GENITOURINARY: No dysuria or hematuria. ENDOCRINE: No heat or cold intolerance. PSYCH: History of anxiety, but no suicidal ideation. NEUROLOGIC: No transient ischemic attack, stroke, or seizure symptoms. MUSCULOSKELETAL: Positive for lower extremity pain and swelling.   PHYSICAL EXAMINATION:   GENERAL: This is a well-developed, well-nourished white female who is lying quietly in the bed saturating approximately 90% on 2 liters nasal cannula.   VITAL SIGNS: Temperature 98.1, pulse 88, blood pressure 109/68.   HEAD: Normocephalic, atraumatic.   EYES: Sclerae anicteric. Conjunctivae are clear.   EARS: Normal external appearance. Hearing is intact.   NECK: Supple without adenopathy or jugular venous distention.   HEART: Regular rate and rhythm without murmurs, rubs, or gallops.   LUNGS: Somewhat diminished with expiratory wheezes bilaterally.   ABDOMEN: Soft, nondistended, and nontender.   EXTREMITIES: She does have mild to moderate lower extremity edema bilaterally with chronic stasis changes present. Feet are warm with good capillary refill.  NEUROLOGIC: Normal strength and tone in all four extremities.   PSYCH: Normal affect and mood.   LABORATORY DATA: Sodium 142, potassium 3.2, chloride 105, CO2 29,  BUN 7, creatinine 0.61, glucose 108. White blood cell count 14.1, hemoglobin 10.0, platelet count 93,000.   ASSESSMENT AND PLAN: This is a 74 year old white female with bilateral lower extremity DVTs and recent colitis with gastrointestinal bleeding. She is reasonably anemic and thrombocytopenic at baseline and is not a candidate for anticoagulation. For this reason, an IVC filter is certainly reasonable, and she desires to have this placed. We will place this today.   This is a level-3 consultation. ____________________________ Annice Needy, MD jsd:slb D: 09/27/2011 16:23:56 ET T: 09/27/2011 16:58:37 ET JOB#: [id number]  cc: Annice Needy, MD, <Dictator> Annice Needy MD ELECTRONICALLY SIGNED 09/29/2011 13:25

## 2014-11-29 NOTE — Consult Note (Signed)
Reason for Visit: This 74 year old Female patient presents to the clinic for initial evaluation of  Adenocarcinoma of the lung .   Referred by Dr. Orlie DakinFinnegan.  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 She was found to have a spiculated mass in the right upper lobe. It measured approximately 2.5 cm in greatest dimension. Lesion was also PET positive. There was some faint avid uptake in her mediastinal nodes. She underwent a CT-guided biopsy x2 1st one being inconclusive. The 2nd one was positive for non-small cell lung cancer favoring adenocarcinoma. She continues to have mild shortness of breath. She had pulmonary function tests I have not seen those results but based on those results she is a nonsurgical candidate according to thoracic oncology. She seen today for opinion regarding treatment. She specifically denies cough hemoptysis. She continues on nasal oxygen. She is having no significant chest pain at this time.  Past Hx:    Colitis, lower bleed:    PVD:    coronary artery disease:    Hyperlipidemia:    Anxiety:    Hypertension:    Bilateral tubal ligation:   Past, Family and Social History:   Past Medical History positive    Cardiovascular coronary artery disease; hyperlipidemia; hypertension; peripheral vascular disease    Gastrointestinal Colitis    Genitourinary Lower GI bleed, colitis    Neurological/Psychiatric anxiety    Past Surgical History Bilateral tubal ligation    Family History positive    Family History Comments Sister with cancer expired at age 74    Social History positive    Social History  Comments Greater than 40-pack-year smoking history quit smoking 2000 well   Allergies:   No Known Allergies:   Home Meds:  Home Medications: Medication Instructions Status  acetaminophen-HYDROcodone 500 mg-5 mg tablet 1 tab(s) orally every 6 to 8 hours x 30 days, As Needed- for Pain  Active  Xarelto 20 mg oral tablet 1 tab(s) orally once a day (in the evening) Active  Lasix 20 mg oral tablet 1 tab(s) orally once a day Active  alprazolam 0.25 mg oral tablet 1 tab(s) orally 3 times a day Active  albuterol-ipratropium 103 mcg-18 mcg/inh inhalation aerosol 1 puff(s) inhaled once a day Active  metoprolol tartrate 25 mg oral tablet 1 tab(s) orally 2 times a day Active  Lotrel 5 mg-10 mg oral capsule 1 cap(s) orally once a day Active  Lipitor 20 mg oral tablet 1 tab(s) orally once a day (at bedtime) Active  Spiriva 18 mcg inhalation capsule 1 each inhaled once a day Active   Review of Systems:   General negative    Performance Status (ECOG) 0    Skin negative    Breast negative    Ophthalmologic negative    ENMT negative    Respiratory and Thorax see HPI    Cardiovascular negative    Gastrointestinal see HPI    Genitourinary negative    Musculoskeletal negative    Neurological negative    Psychiatric negative    Hematology/Lymphatics negative    Endocrine negative    Allergic/Immunologic negative   Physical Exam:  General/Skin/HEENT:   General normal    Skin normal    Eyes normal    ENMT normal    Head and Neck normal    Additional PE Thin female in NAD. She is on nasal oxygen. Neck is clear. No cervical or supraclavicular adenopathy is appreciated. Lungs are clear to A&P cardiac examination shows regular rate and rhythm without murmur rub or thrill. Abdomen is benign with no organomegaly or masses noted.   Breasts/Resp/CV/GI/GU:   Respiratory and Thorax normal    Cardiovascular normal    Gastrointestinal normal    Genitourinary normal    MS/Neuro/Psych/Lymph:   Musculoskeletal normal    Neurological normal    Lymphatics normal   Other Results:  Radiology Results: CT:    16-Feb-13 16:00, CT Head Without Contrast   CT Head Without Contrast    REASON FOR EXAM:    confusion  COMMENTS:       PROCEDURE: CT  - CT HEAD WITHOUT CONTRAST  - Sep 23 2011  4:00PM     RESULT: Emergent noncontrast CT of the brain is performed. The patient   has no previous exam for comparison.    There is an area of low-attenuation in appears represent small old   infarct in the posterior parietal region on the right appreciated between   images 18 and 23. Tiny old left cerebellar infarct appears present on   image 7 and image 6. Subacute infarction not completelyexcluded but felt   to be less likely. There is no evidence of intracranial hemorrhage, mass   effect or midline shift. Some mild low-attenuation is noted in the   periventricular and subcortical white matter regions consistent with     chronic smallvessel ischemic disease. The included sinuses and mastoid   air cells show normal aeration.    IMPRESSION:   1. 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 No  definite acute intracranial abnormality evident.          Verified By: Elveria Royals, M.D., MD    03-Apr-13 15:32, CT Chest With Contrast   CT Chest With Contrast    REASON FOR EXAM:    RU lobe lesion  COMMENTS:       PROCEDURE: CT  - CT CHEST WITH CONTRAST  - Nov 08 2011  3:32PM     RESULT: Comparison is made to a prior study dated 09/26/2011.    Technique: Helical 5 mm sections were obtained from the thoracic inlet   through the lung bases status post intravenous administration of 60 mL   Isovue-300.    Findings:  Evaluation of the mediastinum demonstrates precarinal   adenopathy. Largest nodal mass measures approximately 1.52 cm in shortest   dimension. Smaller  lymph nodes are identified within the AP window.    Previously described pleural-based spiculated density within the right   upper lobe is once again appreciated and appears more prominent measuring   0.55 x 3.51 cm in orthogonal dimensions. A second nodule is identified   within the left lower lobe medial basal segment. This is calcified   indicative of a calcified granuloma. Fluid versus thickening is   identified along the major fissure on the right. There are fibrotic and   emphysematous changes throughout both lungs. Multiple calcifications are   identified within the spleen. An inferior vena cava filter is   appreciated. The visualized upper abdominal viscera are unremarkable.  IMPRESSION:      1. Increased conspicuity of the spiculated density along the periphery of   the right mid hemithorax. Mediastinal adenopathy is appreciated.  2. Not mentioned above, stable areas of partially calcified apical     pleural plaques are identified. These findings may represent the sequela   of prior asbestos exposure. The peripheral density in the right   hemithorax, in the appropriate clinical setting, is concerning for   neoplastic disease particularly in the absence of signs and symptoms of   infection. There are also findings concerning for mediastinal metastatic   adenopathy.    Thank you for the opportunity to contribute to the care of your patient.           Verified By: Jani Files, M.D., MD  Nuclear Med:    21-Feb-13 14:44, PET/CT Scan Lung Cancer Initial Staging   PET/CT Scan Lung Cancer Initial Staging    REASON FOR EXAM:    pulm nodules, med adenopathy  COMMENTS:       PROCEDURE: PET - PET/CT INIT STAGING LUNG CA  - Sep 28 2011  2:44PM     RESULT:     Procedure: Total body PET was performed in conjunction with a   non-attenuated CT status post right hand-injection of 12.67 mCi of F-18   labeled fluorodeoxyglucose. The patient's fasting blood glucose was    measured at 116 mg/dl.     Injection time is at 12:53 p.m. with scan start time at 2:08 p.m. and a   scan end time at 2:30 p.m.    Findings: Appropriate biodistribution is appreciated within the base of   the brain, heart, liver, kidneys, ureters, bladder, and bowel. An area of   hypermetabolic activity projects within a subpleural soft tissue density   along the lateral periphery of the right upper lobe demonstrating a mean   SUV of 4.5. A weakly avid lymph node is identified within the right   axillary region demonstrating a mean SUV of 1.64. Small bilateral   effusions are identified. There are no further foci of abnormal FDG   activity identified.    Evaluation of the noncontrasted CT demonstrates diffuse nephrocalcinosis   involving the left kidney.    IMPRESSION:      1. FDG avid subpleural mass within the right upper lobe. In the absence     of known infection this finding is consistent with neoplastic disease   until proven otherwise.  2. Findings concerning for early or mild nodal activity in the right   axilla.      Thank you for the opportunity to contribute to the care of your patient.           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  Plan:   I agree with Dr. Inez Catalina that surgical option is not possible this patient with her poor pulmonary functions. She also probably has early involvement of her mediastinal nodes with slight avid uptake on my review on PET/CT. I would favor IMRT radiation therapy. I would treat her primary tumor to 7000 cGy over 7 weeks and dose paint her mediastinal nodes up to 5400 cGy. We will also discussed Dr. Orlie Dakin giving radiation sensitizing chemotherapy along with radiation. Risks and benefits of treatment including fatigue, skin reaction, damage to normal lungs and possible  esophagitis were all reviewed in  detail with the patient. She seems to comprehend my treatment plan well. I have set her up for CT simulation next week.  I would like to take this opportunity to thank you for allowing me to continue to participate in this patient's care.  CC Referral:   cc: Dr. Barry Brunner   Electronic Signatures: Rushie Chestnut, Gordy Councilman (MD)  (Signed 24-Apr-13 14:07)  Authored: HPI, Diagnosis, Past Hx, PFSH, Allergies, Home Meds, ROS, Physical Exam, Other Results, Encounter Assessment and Plan, CC Referring Physician   Last Updated: 24-Apr-13 14:07 by Rebeca Alert (MD)

## 2014-11-29 NOTE — Consult Note (Signed)
General Aspect 74 year old female with a history of hyperlipidemia, CAD by cardiac cath in 2001 "40% disease" at that time, hypertension who presents to the Emergency Room with 3 to 4 episodes of dark stools starting on Saturday. Cardiology was consulted for elevated cardiac enz and ABN EKG.  She reports nausea/vomiting, dark BMS, stomach upset, dizziness on Satuday around 1 pm. Dizziness resolved. no chest pain. Sx persisted into Sunday and she presented to the ER in early afternoon. She has been having lower abdominal pain and cramping.  In the Emergency Room, the patient was hemodynamically stable, but was noted as having an abnormal EKG with a mildly elevated troponin. CT ABD was performed concerning for colitis HCT drop from 41 to 33 today.  She does report lower extremity edema, worse on her feet.   PAST MEDICAL HISTORY:  1. Benign hypertension.  2. Hyperlipidemia.  3. Venostasis with peripheral edema.  4. Anxiety.   MEDICATIONS:  1. Xanax 0.25 mg p.o. every six hours p.r.n.  2. Lotrel 10/20, one p.o. daily.  3. Lipitor 20 mg p.o. daily.  4. Maxzide 37.5/25 mg 1 p.o. daily.  5. Aspirin 81 mg p.o. daily.   ALLERGIES: No known drug allergies.   SOCIAL HISTORY: The patient quit smoking one month ago. Started at age 41 yo,  No history of alcohol abuse. Husband passed away. Still works, on her feet long periods.   FAMILY HISTORY: Positive for hypertension and stroke.   Physical Exam:   GEN WD, WN, NAD    HEENT red conjunctivae    NECK supple    RESP normal resp effort  clear BS    CARD Regular rate and rhythm  No murmur    ABD denies tenderness  soft    LYMPH negative neck    EXTR negative edema    SKIN normal to palpation    NEURO motor/sensory function intact    PSYCH alert, A+O to time, place, person, good insight   Review of Systems:   Subjective/Chief Complaint ABD crmaping, diarrhea, melana    General: Weakness  See above    Skin: No Complaints     ENT: No Complaints    Eyes: No Complaints    Neck: No Complaints    Respiratory: No Complaints    Cardiovascular: No Complaints    Gastrointestinal: Nausea  Diarrhea  Rectal Bleeding  Cramping    Genitourinary: No Complaints    Vascular: No Complaints    Musculoskeletal: No Complaints    Neurologic: No Complaints    Hematologic: No Complaints    Endocrine: No Complaints    Psychiatric: No Complaints    Review of Systems: All other systems were reviewed and found to be negative    Medications/Allergies Reviewed Medications/Allergies reviewed        Admit Diagnosis:   GI BLEED ELEVAATED TROPONIN WEAKNESS: 18-Sep-2011, Active, GI BLEED ELEVAATED TROPONIN WEAKNESS   ABDNORMAL EKG GASTROINTESTINAL HEMORRHAG: Active, ABDNORMAL EKG GASTROINTESTINAL HEMORRHAG      Admit Reason:   Weakness: (780.79) Active, ICD9, Other malaise and fatigue   Gastrointestinal hemorrhage: (578.9) Active, ICD9, Unspecified, hemorrhage of gastrointestinal tract   Elevated troponin level: (790.6) Active, ICD9, Other abnormal blood chemistry   Abnormal EKG: (794.31) Active, ICD9, Nonspecific abnormal electrocardiogram (ECG) (EKG)    Routine Hem:  11-Feb-13 00:46    WBC (CBC) 12.3   RBC (CBC) 3.71   Hemoglobin (CBC) 11.7   Hematocrit (CBC) 33.9   Platelet Count (CBC) 189   MCV 91  MCH 31.5   MCHC 34.5   RDW 12.6  Routine Chem:  11-Feb-13 00:46    Glucose, Serum 127   BUN 9   Creatinine (comp) 0.50   Sodium, Serum 140   Potassium, Serum 3.2   Chloride, Serum 102   CO2, Serum 26   Calcium (Total), Serum 8.0   Osmolality (calc) 280   eGFR (African American) >60   eGFR (Non-African American) >60   Anion Gap 12  Cardiac:  11-Feb-13 00:46    Troponin I 0.26   CK, Total 47   CPK-MB, Serum 1.3  Routine Hem:  11-Feb-13 00:46    Neutrophil % 82.2   Lymphocyte % 11.2   Monocyte % 6.0   Eosinophil % 0.6   Basophil % 0.0   Neutrophil # 10.1   Lymphocyte # 1.4   Monocyte # 0.7    Eosinophil # 0.1   Basophil # 0.0  Routine Chem:  11-Feb-13 00:46    Cholesterol, Serum 128   Triglycerides, Serum 68   HDL (INHOUSE) 38   VLDL Cholesterol Calculated 14   LDL Cholesterol Calculated 76  Cardiac:  11-Feb-13 09:45    Troponin I 0.16   CK, Total 50   CPK-MB, Serum 1.2   EKG:   Interpretation EKG shows NSR with rate of 78 bpm, nonspecificn ST ABN in V1 and V2, unable to exclude anteroseptal infarct   Radiology Results: CT:    10-Feb-13 21:43, CT Abdomen and Pelvis With Contrast   CT Abdomen and Pelvis With Contrast    REASON FOR EXAM:    (1) abd. pain; (2) pelvic pain  COMMENTS:       PROCEDURE: CT  - CT ABDOMEN / PELVIS  W  - Sep 17 2011  9:43PM     RESULT: Axial CT scanning was performed through the abdomen and pelvis   with reconstructions at 3 mm intervals andslice thicknesses. The patient   received 100 cc of Isovue-370 as well as oral contrast material. Review   of multiplanar reconstructed images was performed separately on the VIA   monitor.    The ascending colon exhibits normal contrast and gas pattern. The wall is   not thickened. A normal appendix is demonstrated. At the level of the   hepatic flexure there is a transition to a markedly thick-walled   appearance of the transverse colon extending to involve nearly the entire     descending colon. There is increased density in the pericolonic fat. The   colonic lumen is quite narrowed. There is a small amount of free fluid in   the left paracolic gutter and in the pelvis. The rectosigmoid colon is   normal in appearance and contrast has reached the rectum. The small bowel   loops do not appear abnormally distended. There is no inguinal nor   umbilical hernia.    The liver exhibits no focal mass. The gallbladder is adequately distended   with no evidence of stones. The pancreas, nondistended stomach, adrenal   glands, and kidneys are normal in appearance. There are numerous punctate    calcifications within the spleen. There is mild ectasia of the upper and   mid abdominal aorta. The maximal diameter is in the transverse plane in   midmid aorta and measures 2.7 cm. The origins of the celiac trunk and   the superior mesenteric artery appear patent. A small inferior mesenteric   artery trunk is demonstrated and appears to contain contrast. I see no  periaortic nor pericaval lymphadenopathy. No bulky mesenteric   lymphadenopathy is demonstrated.    Within the pelvis the partially distended urinary bladder is normal in   appearance. The uterus and adnexal structures exhibit no acute   abnormality.    There is a 1.5 cm diameter densely calcified nodule in the left lower   lobe. I see no evidence of pneumonia nor is there evidence of a pleural   effusion. There is a partially imaged soft tissue density nodule   measuring approximately 3 mm in diameter along the lateral pleural   surface in the the right lung on image 1. The lumbar vertebral bodies are   preserved in height. There is superior endplate depression at L5.    IMPRESSION:   1. There is markedly abnormal appearance of the transverse colon and   descending colon.The pattern suggests colitis either infectious or   ischemic. There is a small amount of free fluid in the left paracolic   gutter and within the pelvis. Proximal and distal to this portion of the   colon the contrast and gas pattern in the colon appears normal. There is   no small bowel obstruction.  2. There is evidence of previous granulomatous infection of the lungs and   spleen.  3. I see no acute hepatobiliary abnormality nor acute urinary tract   abnormality.          Verified By: Abel Presto, M.D., MD    No Known Allergies:   Vital Signs/Nurse's Notes: **Vital Signs.:   11-Feb-13 09:04   Vital Signs Type Pre Medication   Temperature Temperature (F) 98   Celsius 36.6   Temperature Source oral   Pulse Pulse 93   Pulse source  per Dinamap   Respirations Respirations 18   Systolic BP Systolic BP 092   Diastolic BP (mmHg) Diastolic BP (mmHg) 51   Mean BP 72   BP Source Dinamap   Pulse Ox % Pulse Ox % 93   Pulse Ox Activity Level  At rest   Oxygen Delivery Room Air/ 21 %     Impression 74 year old female with a history of hyperlipidemia, CAD by cardiac cath in 2001 "40% disease" at that time, hypertension who presents to the Emergency Room with 3 to 4 episodes of dark stools starting on Saturday.   A/P; 1) Elevated cardiac ENZ TNT trending down She has CAD by cardiac cath >10 years ago, no recent chest pain sx. Some SOB possible secondary to recent URI in setting of COPD. Suspect elevated cardiac enz are from stressful events on Saturday (N/V/dizzy) in setting of underlying CAD. --No cardiac cath planned given underlying GI Bleed. --Could perform a lexiscan myoview (patient unable to treadmill). Stress tes could be done at anytime, depending on GI and whether they would like this done before or after any EGD/COLO. Stress test could possible be done tomorrow if needed for pre-procedure evaluation. --ECHO pending  2) GI bleed CT scan concerning for colitis, unable to exclude ischemia. She would probably be high risk of ischemic bowel given 50 years of smoking. No arrhythmia noted concerning for embolic. -- Gi consult pending  3)HTN: Would hold lotrel for now and lasix Can restart later. BP borderline low   Electronic Signatures: Ida Rogue (MD)  (Signed 11-Feb-13 11:46)  Authored: General Aspect/Present Illness, History and Physical Exam, Review of System, Health Issues, Home Medications, Labs, EKG , Radiology, Allergies, Vital Signs/Nurse's Notes, Impression/Plan   Last Updated: 11-Feb-13 11:46 by Ida Rogue (MD)

## 2014-11-29 NOTE — Op Note (Signed)
PATIENT NAME:  [name], [name] Porter DATE OF BIRTH:  01/19/1941  DATE OF PROCEDURE:  12/06/2011  PREOPERATIVE DIAGNOSIS: Lung cancer.   POSTOPERATIVE DIAGNOSIS: Lung cancer.  PROCEDURES PERFORMED: 1. Ultrasound guidance for vascular access, left jugular vein.  2. Fluoroscopic guidance for placement of catheter.  3. Placement of a CT compatible Infuse-a-Port, left jugular vein.   SURGEON: Annice NeedyJason S. Dannetta Lekas, M.D.   ANESTHESIA: Local with moderate conscious sedation.   ESTIMATED BLOOD LOSS: 25 mL.   INDICATION FOR PROCEDURE: The patient is a 74 year old female with lung cancer. She is scheduled to begin chemotherapy later this week and needs Port-A-Cath for chemotherapy access. The risks and benefits were discussed and informed consent was obtained.   DESCRIPTION OF PROCEDURE: The patient was brought to the vascular interventional radiology suite. The left neck and chest were sterilely prepped and draped and a sterile surgical field was created. The left jugular vein was visualized with ultrasound and found to widely patent. It was then accessed under direct ultrasound guidance without difficulty with a Seldinger needle and a J-wire was placed. After skin and dilatation, the peel-away sheath was placed over the wire and the wire and dilator were removed. A transverse incision was created two fingerbreadths below the left clavicle and we created an inferior pocket. The port was secured to the pocket with two Prolene sutures connected to the catheter. The catheter was tunneled from the subclavicular incision to the access site. Fluoroscopic guidance was then used to cut the catheter to an appropriate length. This was then placed through the peel-away sheath and the peel-away sheath removed. The catheter tip was found to be in excellent location, at the cavoatrial junction without kink. The wound was irrigated with antibiotic impregnated saline and closed with 3-0 Vicryl and a Monocryl. The access  incision was closed with a single 4-0 Monocryl. The NanuetHuber needle was used to access the port. It withdrew blood well and flushed easily with heparinized saline, and a sterile dressing was placed. The patient tolerated the procedure well and was taken to the recovery room in stable condition. ____________________________ Annice NeedyJason S. Johnmatthew Solorio, MD jsd:slb D: 12/06/2011 14:43:05 ET T: 12/06/2011 15:26:08 ET JOB#: [id number]  cc: Annice NeedyJason S. Gianni Mihalik, MD, <Dictator> Tollie Pizzaimothy J. Orlie DakinFinnegan, MD Annice NeedyJASON S Zurii Hewes MD ELECTRONICALLY SIGNED 12/07/2011 13:56

## 2014-11-29 NOTE — Consult Note (Signed)
Chief Complaint:   Subjective/Chief Complaint Feels better. Diarrhea improved with minimal dark blood. H and H stable.   VITAL SIGNS/ANCILLARY NOTES: **Vital Signs.:   12-Feb-13 17:58   Vital Signs Type Routine   Temperature Temperature (F) 97.3   Celsius 36.2   Temperature Source oral   Pulse Pulse 81   Pulse source per Dinamap   Respirations Respirations 19   Systolic BP Systolic BP 115   Diastolic BP (mmHg) Diastolic BP (mmHg) 69   Mean BP 95   BP Source Dinamap   Pulse Ox % Pulse Ox % 94   Pulse Ox Activity Level  At rest   Oxygen Delivery Room Air/ 21 %   Routine Hem:  12-Feb-13 10:33    WBC (CBC) 13.0   RBC (CBC) 3.97   Hemoglobin (CBC) 12.4   Hematocrit (CBC) 36.8   Platelet Count (CBC) 166   MCV 93   MCH 31.2   MCHC 33.7   RDW 12.9  Routine Chem:  12-Feb-13 10:33    Glucose, Serum 126   BUN 5   Creatinine (comp) 0.64   Sodium, Serum 138   Potassium, Serum 4.0   Chloride, Serum 103   CO2, Serum 30   Calcium (Total), Serum 8.7   Osmolality (calc) 274   eGFR (African American) >60   eGFR (Non-African American) >60   Anion Gap 5  Routine Hem:  12-Feb-13 10:33    Neutrophil % 82.5   Lymphocyte % 10.0   Monocyte % 5.8   Eosinophil % 1.4   Basophil % 0.3   Neutrophil # 10.7   Lymphocyte # 1.3   Monocyte # 0.8   Eosinophil # 0.2   Basophil # 0.0   Assessment/Plan:  Assessment/Plan:   Assessment Colitis. Infectious vs ischemic. CT did not show any significant vascular lesion. Stool studies normal. Overall better. No sgns of active GI bleed. H and H stable.    Plan Agree with advancing to full liquid diet. Continue antibiotics for a total of 10-14 days. Patient to follow up with me in 2 weeks. Orders written.   Electronic Signatures: Jill Side (MD)  (Signed 12-Feb-13 18:15)  Authored: Chief Complaint, VITAL SIGNS/ANCILLARY NOTES, Lab Results, Assessment/Plan   Last Updated: 12-Feb-13 18:15 by Jill Side (MD)

## 2014-11-29 NOTE — Discharge Summary (Signed)
PATIENT NAME:  [name] Porter, [name] Porter DATE OF BIRTH:  October 15, 1940  DATE OF ADMISSION:  09/26/2011 DATE OF DISCHARGE:  10/03/2011  PRESENTING COMPLAINT: Shortness of breath.   DISCHARGE DIAGNOSES:  1. Acute hypoxic respiratory failure due to chronic obstructive pulmonary disease flare.  2. Hypoxemia, now on home oxygen.  3. Bilateral lower extremity deep vein thrombosis.  4. Left lower lobe pulmonary embolus.  5. Hypertension.  6. Right upper lobe lung mass status post biopsy.   PROCEDURES:   1. IVC filter placement 02/20 by Dr. Wyn Quakerew.   2. CT-guided biopsy of the right upper lobe mass was done by Dr. Register on 02/25, results pending.    CONDITION ON DISCHARGE: Fair. Vitals stable. Patient will require home oxygen 3 liters per minute.   MEDICATIONS:  1. Lipitor 20 mg p.o. daily at bedtime.  2. Lotrel 5/10, 1 p.o. daily.  3. Xanax 0.25 p.o. t.i.d. as needed.  4. Norco 5/325, 1 every six hours as needed.  5. Metoprolol 25 mg b.i.d.  6. Prednisone taper.  7. Albuterol oral inhaler 2 puffs q.6 p.r.n.  8. Spiriva 1 capsule inhalation daily.  9. Xarelto 15 mg b.i.d. for 21 days, start date 10/03/2011 and then 20 mg p.o. daily for treatment of pulmonary embolus and deep vein thrombosis.   FOLLOW UP:  1. Follow up with Dr. Orlie DakinFinnegan 03/01 at 3:00 p.m.  2. Follow up with Dr. Meredeth IdeFleming on 03/04 at 9:30 a.m.  3. Follow up with Dr. Beckey DowningWillett 03/12 at 10:15 a.m.  4. Home health R.N. and physical therapy.   CONSULTATIONS: 1. Dr. Wyn Quakerew, vascular. 2. Dr. Meredeth IdeFleming, pulmonology. 3. Dr. Orlie DakinFinnegan, [facility]. 4. Dr. Harvie JuniorPhifer, palliative care.   LABORATORY, DIAGNOSTIC AND RADIOLOGICAL DATA: Labs on discharge: Hemoglobin 10.4, platelet count 374. Urinalysis negative for urinary tract infection. Creatinine 0.72, sodium 143, potassium 3.2, chloride 107, bicarbonate 28, calcium 7.6, platelet count 233. Magnesium was 1.2 on admission. PT-INR 18.7 and 1.5. Troponin 0.32. Urine culture 15,000  enterococcus faecium.  CT of the chest for PE on 02/19 showed evaluation of segmental pulmonary arteries limited by respiratory motion, however, there is concern for PE in the left lower lobe. Heterogenous masslike opacity with spiculation in the right upper lobe. Enlarged mediastinal hilar lymph nodes are nonspecific. Multiple indeterminate bilateral subcentimeter pulmonary nodules right, largest of which measures 8 mm in the right lower lobe and demonstrates some spiculation. Heterogenous opacification of left kidney with multiple areas of low attenuation are completely visualized but new from recent prior abdominal CT.   Lower extremity ultrasound shows nonocclusive thrombus in the proximal superficial femoral on the right. There is nonocclusive thrombus involving the common femoral on the left. PET scan of the lungs shows subpleural mass within the right upper lobe. In absence of known infection findings consistent with neoplastic until proven otherwise.   BRIEF SUMMARY OF HOSPITAL COURSE: Ms. [name] Porter is a 74 year old Caucasian female with past medical history of chronic obstructive pulmonary disease and tobacco abuse in the past is admitted with:  1. Acute hypoxic respiratory failure secondary to chronic obstructive pulmonary disease exacerbation. Patient was on IV steroids, changed to p.o. taper along with inhalers and Spiriva and oxygen. Respiratory status improved, was nearing at baseline; however, patient did require home oxygen to go with which has been set up. CT scan on admission showed left lower lobe pulmonary embolus. Patient was started on IV heparin and after CT-guided biopsy was completed patient's heparin was discontinued. She was started on Xarelto for treatment  of pulmonary embolus and deep vein thrombosis.  2. Suspected lung cancer. PET-positive in right upper lobe close to the pleura status post CT-guided biopsy by Dr. Rosalene Billings 10/02/2011. Pathology results still pending. Patient was  started on p.o. Xarelto for treatment of deep venous thrombosis and pulmonary embolus. She will follow up with Dr. Orlie Dakin as outpatient for follow up on her right upper lobe mass.  3. Bilateral lower extremity deep venous thrombosis and PE status post IVC filter by Dr. Wyn Quaker on 02/20. Patient was on IV heparin until she got her CT-guided biopsy which was completed on 02/25 and thereafter patient was started on p.o. Xarelto for treatment of deep venous thrombosis and PE.  4. Elevated troponin, was likely demand ischemia in the setting of hypoxia and respiratory distress. Myoview last admission negative for ischemia. CK and MB not elevated. Echo showed ejection fraction of 55% in February 2013. She was continued on beta blockers and statin.  5. Hypokalemia, hypomagnesemia were repleted.  6. Hypertension. Patient is on Lotrel and Lopressor.  7. Generalized weakness and deconditioning. Patient was seen by physical therapy. Home health, physical therapy and R.N. has been set up.  8. Hospital stay otherwise remained stable. CODE STATUS: Patient remained a FULL CODE.   TIME SPENT: 40 minutes.   ____________________________ Wylie Hail Allena Katz, MD sap:cms D: 10/03/2011 15:31:32 ET T: 10/04/2011 09:16:57 ET JOB#: [id number]  cc: Benny Deutschman A. Allena Katz, MD, <Dictator> Annice Needy, MD Herbon E. Meredeth Ide, MD Tollie Pizza. Orlie Dakin, MD Ned Grace, MD Jorje Guild. Beckey Downing, MD Willow Ora MD ELECTRONICALLY SIGNED 10/11/2011 6:25

## 2014-11-29 NOTE — H&P (Signed)
PATIENT NAME:  [name] Porter, [name] MR#:  [id number] DATE OF BIRTH:  [birth year]  DATE OF ADMISSION:  09/26/2011  REFERRING PHYSICIAN: Dr. Carollee MassedKaminski PRIMARY CARE PHYSICIAN: Dr. Beckey DowningWillett  PRESENTING COMPLAINT: Worsening weakness and deconditioning with ambulatory dysfunction and shortness of breath with minimal exertion.   HISTORY OF PRESENT ILLNESS: [name] Porter is a pleasant 74 year old woman with history of hypertension, hyperlipidemia, coronary artery disease, venous stasis recently admitted from 02/10 to 09/20/2011 for management of colitis and lower bleed and hospitalization complicated by abnormal troponin and EKG thought to be demand ischemia from her bleed and infection. She had echocardiogram with normal ejection fraction and a Myoview that was negative for ischemia. Patient after being discharged on 09/20/2011 was not improving. She continued to have weakness and was brought in by her family on 09/23/2011, was diagnosed with urinary tract infection in the ED and discharged home on Cefpodoxime for seven days; however, patient returns again. Her son is present in the room. She continues to have worsening exhaustion. She gets short winded and short of breath even with sitting up, rolling in bed. She has been staying in bed. She has had poor ambulatory effort to even go to the bathroom, needs assistance. This is absolutely nee for patient. She is typically very active and independent, does her own chores, still works full time. In reviewing her history it sounds like patient quit tobacco back in December 2012. She developed some respiratory and flulike symptoms, was initially started on Z-Pak in the beginning of January and did not complete as her symptoms were not improving. Dr. Beckey DowningWillett saw patient on 08/28/2011 and was given prescription for doxycycline. She completed a 10 day course and patient had improvement in her symptoms and then the issues of lower gastrointestinal bleed and nausea and vomiting occurred  when she was admitted on 09/17/2011. Since then she has continued to have decline as above.   PAST MEDICAL HISTORY:  1. Remote tobacco abuse, quit smoking in December 2012.  2. Hypertension.  3. Hyperlipidemia.  4. Venous stasis with peripheral edema.  5. Anxiety.  6. Coronary artery disease.  7. Admitted 02/10 to 09/20/2011 with colitis and lower bleed with complication of elevated troponin and abnormal EKG as well as leukocytosis, hypokalemia and hyperglycemia.   PAST SURGICAL HISTORY: Bilateral tubal ligation.   ALLERGIES: No known drug allergies.   MEDICATIONS:  1. Lipitor 20 mg daily.  2. Lopressor 25 mg b.i.d.  3. Lotrel 5/10 mg 1 tablet daily.  4. Xanax 0.25 mg every eight hours as needed.  5. Norco 5/325, 1 tablet every six hours as needed.  6. Ciprofloxacin 500 mg b.i.d., stop on 09/30/2011.  7. Flagyl 500 mg t.i.d. for 10 days, stop on 09/30/2011.  8. Cefpodoxime initiated on 09/25/2011 for seven days 200 mg b.i.d.   SOCIAL HISTORY: Patient quit tobacco in December 2012. No prior history of alcohol or drug use. She lives in [place] alone. She is still working full time, completely independent and active. This is absolutely unusual for her.   FAMILY HISTORY: Denies any diabetes, heart attacks or strokes.    REVIEW OF SYSTEMS: CONSTITUTIONAL: Denies any subjective fevers. She has been endorsing chills. No longer having nausea or vomiting. EYES: No cataracts or glaucoma. Reports an episode of blurry vision while she was in the hospital during chest pain episode, no recurrence. ENT: No dysphagia or epistaxis. RESPIRATORY: As per history of present illness with shortness of breath and on exertion. No cough. She has some minimal wheezing. CARDIOVASCULAR:  Endorses intermittent central chest pain, worsening when she lays flat and worsening sometimes when she takes deep breath air. Very minimal exertion caused worsening shortness of breath. She reports her lower extremity swelling is  actually a little bit better from when she was admitted in the hospital but does have worsening lower extremity swelling from her baseline bilaterally. No palpitations or syncope. She endorses some lightheadedness. GASTROINTESTINAL: No more nausea, vomiting, diarrhea. Her bloody stools have subsided. GENITOURINARY: Denies any dysuria, hematuria. ENDO: No polyuria or polydipsia. HEMATOLOGIC: No bruising or bleeding. SKIN: No ulcers. MUSCULOSKELETAL: No joint or back or neck pain. NEUROLOGIC: No one-sided weakness or numbness. PSYCH: Denies any depression or suicidal ideation.   PHYSICAL EXAMINATION:  VITAL SIGNS: Temperature 99, pulse 98 to 118, respiratory rate 19, initial blood pressure 140/62, she is currently sating 88% to 93% on 4 liters of oxygen.   GENERAL: Lying in bed, weak appearing.   HEENT: Normocephalic, atraumatic. Pupils equal, symmetric. Nasal cannula in place. Slightly dry mucous membrane.   NECK: Soft and supple. No adenopathy. No JVP.   CARDIOVASCULAR: Slightly tachy. No murmurs, rubs, or gallops.   LUNGS: Basilar crackles. She has diffuse expiratory wheezing that is faint. No use of accessory muscles or increased respiratory effort.   ABDOMEN: Soft. Positive bowel sounds. No mass appreciated.   EXTREMITIES: 2+ edema of her ankle and feet. She has trace edema of her shin.   MUSCULOSKELETAL: No joint effusion.   SKIN: No ulcers.   NEUROLOGIC: She has weakness but symmetrical strength. No focal deficits.   PSYCH: She is alert and oriented. Patient is cooperative.   LABORATORY, DIAGNOSTIC AND RADIOLOGICAL DATA: CT chest PE with multiple findings including questionable PE in the left lower lobe but there is limited due to respiratory motion. There is a spiculated masslike opacity in the right upper lobe. There is multiple bilateral subcentimeter pulmonary nodules of unclear etiology. There is also hilar adenopathy as well as mediastinal adenopathy, also a finding of  heterogeneous opacification of the left kidney with multiple areas of low attenuation which is new from prior CT with concerns for pyelonephritis versus multiple infarcts but less favorable. Urinalysis with specific gravity 1.012, blood 3+, pH 6, nitrite negative, leukocyte esterase 2+, RBC 61 per high-power field, WBC 61 per high-power field. Troponin 0.42. CK 55, MB less than 0.5. WBC 15.1, hemoglobin 11.3, hematocrit 33.3, platelets 79, MCV 92, glucose 104, BUN 9, creatinine 0.81, sodium 138, potassium 3.5, chloride 103, carbon dioxide 30, calcium 8.1. LFTs within normal limits. Total protein 6.1, albumin 2.9. EKG with normal sinus but tachycardic at 116. No ST elevation or depression.   ASSESSMENT AND PLAN: [name] Porter is a 74 year old woman with history of remote tobacco use, hypertension, coronary artery disease, hyperlipidemia, venous stasis, anxiety, returns with further decline and weakness and decreased ambulatory effort, shortness of breath and anorexia.  1. Acute hypoxic respiratory failure likely chronic obstructive pulmonary disease exacerbation. CT scan results as dictated above. Unsure if things have declined related to the CT scan findings at the end of December versus the possible development of PE on top of masses and nodules and adenopathy found on her CT scan that has contributed to her further decline. Her hypoxia is multifactorial with mass and nodules as well as possible pulmonary embolus and chronic obstructive pulmonary disease exacerbation given emphysematous findings on her CT and history of longstanding tobacco use. Will obtain a pulmonary consultation. Cannot empirically anticoagulate due to recent GI bleed. Will get bilateral lower extremity  Doppler's. Questionable if she will need an IVC filter. Will await results prior to vascular consult. Continue her Cipro and Flagyl for her colitis and add ceftriaxone to cover for urinary tract infection. Will start on Solu-Medrol, SVNs, oxygen  and Spiriva.  2. Elevated troponin, likely demand ischemia in the setting of hypoxia and respiratory failure. Her Myoview from last admission was negative for ischemia. CK and MB are not elevated. Still continue to cycle cardiac enzymes. Her echo and Myoview results as dictated above. Will restart her beta blocker and statin. Again will keep aspirin on hold.  3. Urinary tract infection/pyelonephritis. Blood cultures, urine cultures. CT scan results as above. Will again start her on ceftriaxone IV.  4. Hypertension. Restart Lotrel and Lopressor.  5. Anxiety. Xanax as needed.  6. Prophylaxis with TEDs and SCDs and Protonix b.i.d.   TIME SPENT: Approximately 60 minutes spent on complex patient.  ____________________________ [name] Derby, MD ap:cms D: 09/26/2011 23:47:29 ET T: 09/27/2011 06:48:15 ET JOB#: [id number]  cc: [name] Gerhart, MD, <Dictator> [name] Guild. Beckey Downing, MD [name] Derby MD ELECTRONICALLY SIGNED 10/03/2011 1:20
